# Patient Record
Sex: Female | Born: 1970 | Race: White | Hispanic: No | Marital: Married | Smoking: Never smoker
Health system: Southern US, Community
[De-identification: ages and names within clinical notes are randomized; demographics above are authoritative.]

## PROBLEM LIST (undated history)

## (undated) DIAGNOSIS — K219 Gastro-esophageal reflux disease without esophagitis: Secondary | ICD-10-CM

## (undated) DIAGNOSIS — R112 Nausea with vomiting, unspecified: Secondary | ICD-10-CM

## (undated) DIAGNOSIS — I10 Essential (primary) hypertension: Secondary | ICD-10-CM

## (undated) DIAGNOSIS — T7840XA Allergy, unspecified, initial encounter: Secondary | ICD-10-CM

## (undated) DIAGNOSIS — Z9889 Other specified postprocedural states: Secondary | ICD-10-CM

## (undated) DIAGNOSIS — R51 Headache: Secondary | ICD-10-CM

## (undated) DIAGNOSIS — M1990 Unspecified osteoarthritis, unspecified site: Secondary | ICD-10-CM

## (undated) HISTORY — PX: REPLACEMENT TOTAL KNEE: SUR1224

## (undated) HISTORY — DX: Allergy, unspecified, initial encounter: T78.40XA

## (undated) HISTORY — PX: KNEE ARTHROSCOPY: SUR90

## (undated) HISTORY — DX: Essential (primary) hypertension: I10

## (undated) HISTORY — PX: TUBAL LIGATION: SHX77

## (undated) HISTORY — PX: KNEE ARTHROSCOPY W/ ACL RECONSTRUCTION: SHX1858

## (undated) HISTORY — PX: OTHER SURGICAL HISTORY: SHX169

## (undated) HISTORY — PX: ARTHROSCOPIC REPAIR ACL: SUR80

---

## 1990-03-27 HISTORY — PX: BREAST REDUCTION SURGERY: SHX8

## 1999-03-28 HISTORY — PX: CARPAL TUNNEL RELEASE: SHX101

## 2007-03-28 HISTORY — PX: PARTIAL HYSTERECTOMY: SHX80

## 2012-11-28 ENCOUNTER — Encounter (INDEPENDENT_AMBULATORY_CARE_PROVIDER_SITE_OTHER): Payer: Self-pay | Admitting: General Surgery

## 2012-11-28 ENCOUNTER — Ambulatory Visit (INDEPENDENT_AMBULATORY_CARE_PROVIDER_SITE_OTHER): Payer: Managed Care, Other (non HMO) | Admitting: General Surgery

## 2012-11-28 DIAGNOSIS — Z6841 Body Mass Index (BMI) 40.0 and over, adult: Secondary | ICD-10-CM

## 2012-11-28 DIAGNOSIS — M129 Arthropathy, unspecified: Secondary | ICD-10-CM

## 2012-11-28 DIAGNOSIS — E785 Hyperlipidemia, unspecified: Secondary | ICD-10-CM

## 2012-11-28 DIAGNOSIS — M1990 Unspecified osteoarthritis, unspecified site: Secondary | ICD-10-CM

## 2012-11-28 DIAGNOSIS — I10 Essential (primary) hypertension: Secondary | ICD-10-CM

## 2012-11-28 NOTE — Progress Notes (Signed)
Patient ID: Kayla Cooley, female   DOB: Oct 26, 1970, 42 y.o.   MRN: [id number]  Chief Complaint  Patient presents with  . Other    Eval gastric sleeve    HPI Kayla Cooley is a 42 y.o. female.  This patient presents for her initial weight loss surgery evaluation with our practice. She was currently undergoing workup by the weight loss surgery program in [facility] when that program dissolved. She has a tender information session of and is interested in sleeve gastrectomy. She has a BMI of 40 with obesity-related quantities of hypertension and hyperlipidemia and arthritis. She says that she has always troubled with her weight and started dieting at age 59. She has been on several diets and has been successful with Nutrisystem diet where she lost 114 pounds but she regained the weight. She says that she is interested in sleeve gastrectomy because she's not interested in having the device placed in her and she is not interested in regarding the intestines. She says that she is active and is walking about 25 miles 6 days per week although she does have a history of arthritis in her knees and has had several knee replacements and knee surgeries. She denies reflux. HPI  Past Medical History  Diagnosis Date  . Allergy   . Hypertension     Past Surgical History  Procedure Laterality Date  . Total knee arthroplasty  04/2009    left knee  . Partial hysterectomy  2009  . Carpal tunnel release  2001    right hand  . Tubal ligation    . Breast reduction surgery  1992    History reviewed. No pertinent family history.  Social History History  Substance Use Topics  . Smoking status: Never Smoker   . Smokeless tobacco: Not on file  . Alcohol Use: Yes    Allergies  Allergen Reactions  . Aspirin   . Cashew Nut Oil     Current Outpatient Prescriptions  Medication Sig Dispense Refill  . cetirizine (ZYRTEC) 10 MG tablet Take 10 mg by mouth daily.      . metoprolol succinate  (TOPROL-XL) 25 MG 24 hr tablet Take 25 mg by mouth daily.      . Prenatal Vit-Fe Fumarate-FA (PRENATAL MULTIVITAMIN) TABS tablet Take 1 tablet by mouth daily at 12 noon.       No current facility-administered medications for this visit.    Review of Systems Review of Systems All other review of systems negative or noncontributory except as stated in the HPI  Blood pressure 144/84, pulse 82, resp. rate 18, height 5\' 1"  (1.549 m), weight 216 lb (97.977 kg).  Physical Exam Physical Exam Physical Exam  Nursing note and vitals reviewed. Constitutional: She is oriented to person, place, and time. She appears well-developed and well-nourished. No distress.  HENT:  Head: Normocephalic and atraumatic.  Mouth/Throat: No oropharyngeal exudate.  Eyes: Conjunctivae and EOM are normal. Pupils are equal, round, and reactive to light. Right eye exhibits no discharge. Left eye exhibits no discharge. No scleral icterus.  Neck: Normal range of motion. Neck supple. No tracheal deviation present.  Cardiovascular: Normal rate, regular rhythm, normal heart sounds and intact distal pulses.   Pulmonary/Chest: Effort normal and breath sounds normal. No stridor. No respiratory distress. She has no wheezes.  Abdominal: Soft. Bowel sounds are normal. She exhibits no distension and no mass. There is no tenderness. There is no rebound and no guarding.  Musculoskeletal: Normal range of motion. She exhibits no edema  and no tenderness.  Neurological: She is alert and oriented to person, place, and time.  Skin: Skin is warm and dry. No rash noted. She is not diaphoretic. No erythema. No pallor.  Psychiatric: She has a normal mood and affect. Her behavior is normal. Judgment and thought content normal.    Data Reviewed   Assessment    Morbid obesity with a BMI of 40 and obesity related comorbidities of hypertension, hyperlipidemia, and arthritis. We had a long discussion regarding all of the medical and surgical  options for weight loss. We discussed the pros and cons of each option including the lap band, sleeve gastrectomy, and Roux-en-Y gastric bypass. I think that she would be a fine candidate for any of the options which she chooses and she remains interested in vertical sleeve gastrectomy. We did discuss the procedure and its risks. The risks of infection, bleeding, pain, scarring, weight regain, too little or too much weight loss, vitamin deficiencies and need for lifelong vitamin supplementation, hair loss, need for protein supplementation, leaks, stricture, reflux, food intolerance, need for reoperation and conversion to roux Y gastric bypass, need for open surgery, injury to spleen or surrounding structures, DVT's, PE, and death again discussed with the patient and the patient expressed understanding and desires to proceed with laparoscopic vertical sleeve gastrectomy, possible open, intraoperative endoscopy.    Plan    We will set her up for upper GI and nutrition labs as well as psychology and nutrition evaluations.  We will try to obtain her prior records from [place] and go from there.       Lodema Pilot DAVID 11/28/2012, 4:23 PM

## 2012-11-29 LAB — LIPID PANEL
Cholesterol: 181 mg/dL (ref 0–200)
HDL: 45 mg/dL (ref >39)
Total CHOL/HDL Ratio: 4 Ratio
VLDL: 39 mg/dL (ref 0–40)

## 2012-11-29 LAB — COMPREHENSIVE METABOLIC PANEL
AST: 18 U/L (ref 0–37)
Albumin: 4.1 g/dL (ref 3.5–5.2)
Alkaline Phosphatase: 52 U/L (ref 39–117)
BUN: 12 mg/dL (ref 6–23)
Glucose, Bld: 85 mg/dL (ref 70–99)
Potassium: 3.8 mEq/L (ref 3.5–5.3)
Sodium: 139 mEq/L (ref 135–145)
Total Bilirubin: 0.4 mg/dL (ref 0.3–1.2)
Total Protein: 7.1 g/dL (ref 6.0–8.3)

## 2012-11-29 LAB — CBC WITH DIFFERENTIAL/PLATELET
Basophils Absolute: 0 10*3/uL (ref 0.0–0.1)
Basophils Relative: 0 % (ref 0–1)
Eosinophils Absolute: 0.1 10*3/uL (ref 0.0–0.7)
MCH: 28.8 pg (ref 26.0–34.0)
MCHC: 33.6 g/dL (ref 30.0–36.0)
Neutro Abs: 5.4 10*3/uL (ref 1.7–7.7)
Neutrophils Relative %: 75 % (ref 43–77)
RDW: 13.7 % (ref 11.5–15.5)

## 2012-11-29 LAB — T4: T4, Total: 7.8 ug/dL (ref 5.0–12.5)

## 2012-12-10 ENCOUNTER — Other Ambulatory Visit: Payer: Self-pay

## 2012-12-10 ENCOUNTER — Ambulatory Visit (HOSPITAL_COMMUNITY)
Admission: RE | Admit: 2012-12-10 | Discharge: 2012-12-10 | Disposition: A | Discharge status: Home / Self Care | Payer: Managed Care, Other (non HMO) | Source: Ambulatory Visit | Attending: General Surgery | Admitting: General Surgery

## 2012-12-10 DIAGNOSIS — E785 Hyperlipidemia, unspecified: Secondary | ICD-10-CM | POA: Insufficient documentation

## 2012-12-10 DIAGNOSIS — M1990 Unspecified osteoarthritis, unspecified site: Secondary | ICD-10-CM

## 2012-12-10 DIAGNOSIS — I10 Essential (primary) hypertension: Secondary | ICD-10-CM | POA: Insufficient documentation

## 2012-12-10 DIAGNOSIS — M129 Arthropathy, unspecified: Secondary | ICD-10-CM | POA: Insufficient documentation

## 2012-12-10 DIAGNOSIS — Z6841 Body Mass Index (BMI) 40.0 and over, adult: Secondary | ICD-10-CM | POA: Insufficient documentation

## 2012-12-11 ENCOUNTER — Ambulatory Visit (HOSPITAL_COMMUNITY): Payer: Self-pay

## 2012-12-18 ENCOUNTER — Other Ambulatory Visit (HOSPITAL_COMMUNITY): Payer: Self-pay

## 2012-12-26 ENCOUNTER — Other Ambulatory Visit (INDEPENDENT_AMBULATORY_CARE_PROVIDER_SITE_OTHER): Payer: Self-pay | Admitting: General Surgery

## 2013-01-06 ENCOUNTER — Telehealth (INDEPENDENT_AMBULATORY_CARE_PROVIDER_SITE_OTHER): Payer: Self-pay

## 2013-01-06 NOTE — Telephone Encounter (Signed)
Pre-op called stating her pre op schedule showed patient surgery was cancelled for 01-13-13 so she cancelled her pre- op appointment , the patient  Saw pre op cancellation on my Chart then called pre op asking why pre op was cancelled . Please advise Pt # [phone]

## 2013-01-08 ENCOUNTER — Encounter (HOSPITAL_COMMUNITY): Payer: Self-pay | Admitting: Pharmacy Technician

## 2013-01-10 ENCOUNTER — Encounter (HOSPITAL_COMMUNITY)
Admission: RE | Admit: 2013-01-10 | Discharge: 2013-01-10 | Disposition: A | Discharge status: Home / Self Care | Payer: Managed Care, Other (non HMO) | Source: Ambulatory Visit | Attending: General Surgery | Admitting: General Surgery

## 2013-01-10 ENCOUNTER — Ambulatory Visit (INDEPENDENT_AMBULATORY_CARE_PROVIDER_SITE_OTHER): Payer: Managed Care, Other (non HMO) | Admitting: General Surgery

## 2013-01-10 ENCOUNTER — Encounter (INDEPENDENT_AMBULATORY_CARE_PROVIDER_SITE_OTHER): Payer: Self-pay | Admitting: General Surgery

## 2013-01-10 ENCOUNTER — Encounter (HOSPITAL_COMMUNITY): Payer: Self-pay

## 2013-01-10 ENCOUNTER — Ambulatory Visit (HOSPITAL_COMMUNITY)
Admission: RE | Admit: 2013-01-10 | Discharge: 2013-01-10 | Disposition: A | Discharge status: Home / Self Care | Payer: Managed Care, Other (non HMO) | Source: Ambulatory Visit | Attending: General Surgery | Admitting: General Surgery

## 2013-01-10 ENCOUNTER — Other Ambulatory Visit (HOSPITAL_COMMUNITY): Payer: Managed Care, Other (non HMO)

## 2013-01-10 DIAGNOSIS — I10 Essential (primary) hypertension: Secondary | ICD-10-CM

## 2013-01-10 DIAGNOSIS — Z6841 Body Mass Index (BMI) 40.0 and over, adult: Secondary | ICD-10-CM

## 2013-01-10 DIAGNOSIS — Z01812 Encounter for preprocedural laboratory examination: Secondary | ICD-10-CM | POA: Insufficient documentation

## 2013-01-10 DIAGNOSIS — Z01818 Encounter for other preprocedural examination: Secondary | ICD-10-CM | POA: Insufficient documentation

## 2013-01-10 HISTORY — DX: Unspecified osteoarthritis, unspecified site: M19.90

## 2013-01-10 HISTORY — DX: Headache: R51

## 2013-01-10 HISTORY — DX: Nausea with vomiting, unspecified: R11.2

## 2013-01-10 HISTORY — DX: Other specified postprocedural states: Z98.890

## 2013-01-10 LAB — COMPREHENSIVE METABOLIC PANEL
ALT: 28 U/L (ref 0–35)
Alkaline Phosphatase: 59 U/L (ref 39–117)
CO2: 26 mEq/L (ref 19–32)
Chloride: 99 mEq/L (ref 96–112)
GFR calc Af Amer: >90 mL/min (ref >90)
GFR calc non Af Amer: >90 mL/min (ref >90)
Glucose, Bld: 87 mg/dL (ref 70–99)
Potassium: 3.8 mEq/L (ref 3.5–5.1)
Sodium: 136 mEq/L (ref 135–145)
Total Bilirubin: 0.4 mg/dL (ref 0.3–1.2)

## 2013-01-10 LAB — CBC WITH DIFFERENTIAL/PLATELET
Basophils Absolute: 0 10*3/uL (ref 0.0–0.1)
Eosinophils Relative: 1 % (ref 0–5)
Hemoglobin: 13.5 g/dL (ref 12.0–15.0)
Lymphocytes Relative: 23 % (ref 12–46)
Lymphs Abs: 1.6 10*3/uL (ref 0.7–4.0)
MCV: 86 fL (ref 78.0–100.0)
Neutro Abs: 5.1 10*3/uL (ref 1.7–7.7)
Neutrophils Relative %: 71 % (ref 43–77)
Platelets: 236 10*3/uL (ref 150–400)
RBC: 4.58 MIL/uL (ref 3.87–5.11)
WBC: 7.2 10*3/uL (ref 4.0–10.5)

## 2013-01-10 NOTE — Progress Notes (Signed)
Anesthesia will see patient am of surgery 

## 2013-01-10 NOTE — Progress Notes (Signed)
Patient ID: Kayla Cooley, female   DOB: 07-Jan-1971, 42 y.o.   MRN: [id number]  Chief Complaint  Patient presents with  . Bariatric Pre-op    HPI Kayla Cooley is a 42 y.o. female.  This patient presents today for her preoperative surgical evaluation for vertical sleeve gastrectomy. She has a BMI of 40 with obesity related comorbidities of hypertension hyperlipidemia, and arthritis. She has completed her preoperative workup and is ready for her procedure. Her upper GI was negative and her labs within normal. She is scheduled for vertical sleeve gastrectomy next week. She has lost about 8-10 pounds on her preoperative diet and HPI  Past Medical History  Diagnosis Date  . Allergy   . Hypertension     Past Surgical History  Procedure Laterality Date  . Total knee arthroplasty  04/2009    left knee  . Partial hysterectomy  2009  . Carpal tunnel release  2001    right hand  . Tubal ligation    . Breast reduction surgery  1992    History reviewed. No pertinent family history.  Social History History  Substance Use Topics  . Smoking status: Never Smoker   . Smokeless tobacco: Not on file  . Alcohol Use: Yes    Allergies  Allergen Reactions  . Aspirin Hives  . Cashew Nut Oil Hives    Current Outpatient Prescriptions  Medication Sig Dispense Refill  . cetirizine (ZYRTEC) 10 MG tablet Take 10 mg by mouth every morning.       . clobetasol cream (TEMOVATE) 0.05 % Apply 1 application topically daily as needed (For eczema.).      Marland Kitchen metoprolol succinate (TOPROL-XL) 25 MG 24 hr tablet Take 25 mg by mouth every morning.       . metoprolol tartrate (LOPRESSOR) 25 MG tablet       . Prenatal Vit-Fe Fumarate-FA (PRENATAL MULTIVITAMIN) TABS tablet Take 1 tablet by mouth every morning.        No current facility-administered medications for this visit.    Review of Systems Review of Systems All other review of systems negative or noncontributory except as stated in the  HPI  Blood pressure 132/80, pulse 74, temperature 98.1 F (36.7 C), temperature source Temporal, resp. rate 16, height 5\' 1"  (1.549 m), weight 204 lb 12.8 oz (92.897 kg).  Physical Exam Physical Exam Physical Exam  Nursing note and vitals reviewed. Constitutional: She is oriented to person, place, and time. She appears well-developed and well-nourished. No distress.  HENT:  Head: Normocephalic and atraumatic.  Mouth/Throat: No oropharyngeal exudate.  Eyes: Conjunctivae and EOM are normal. Pupils are equal, round, and reactive to light. Right eye exhibits no discharge. Left eye exhibits no discharge. No scleral icterus.  Neck: Normal range of motion. Neck supple. No tracheal deviation present.  Cardiovascular: Normal rate, regular rhythm, normal heart sounds and intact distal pulses.   Pulmonary/Chest: Effort normal and breath sounds normal. No stridor. No respiratory distress. She has no wheezes.  Abdominal: Soft. Bowel sounds are normal. She exhibits no distension and no mass. There is no tenderness. There is no rebound and no guarding.  Musculoskeletal: Normal range of motion. She exhibits no edema and no tenderness.  Neurological: She is alert and oriented to person, place, and time.  Skin: Skin is warm and dry. No rash noted. She is not diaphoretic. No erythema. No pallor.  Psychiatric: She has a normal mood and affect. Her behavior is normal. Judgment and thought content normal.    Data  Reviewed   Assessment    Morbid obesity with a BMI of 40 with obesity related comorbidities of hypertension, hyperlipidemia, and arthritis  She remains motivated and interested in vertical sleeve gastrectomy. We again discussed the options for weight loss and she remains interested in vertical sleeve gastrectomy. We discussed the procedure and its risks. The risks of infection, bleeding, pain, scarring, weight regain, too little or too much weight loss, vitamin deficiencies and need for lifelong  vitamin supplementation, hair loss, need for protein supplementation, leaks, stricture, reflux, food intolerance, need for reoperation and conversion to roux Y gastric bypass, need for open surgery, injury to spleen or surrounding structures, DVT's, PE, and death again discussed with the patient and the patient expressed understanding and desires to proceed with laparoscopic vertical sleeve gastrectomy, possible open, intraoperative endoscopy.     Plan    Will plan for vertical sleeve gastrectomy next week.        Lodema Pilot DAVID 01/10/2013, 9:49 AM

## 2013-01-10 NOTE — Patient Instructions (Signed)
20      Your procedure is scheduled on:  Monday 01/13/2013  Report to [facility] at  1200 pm  Call this number if you have problems the night before or morning of surgery:  [phone]   Remember:             IF YOU USE CPAP,BRING MASK AND TUBING AM OF SURGERY!   Do not eat food or drink liquids AFTER MIDNIGHT!  Take these medicines the morning of surgery with A SIP OF WATER: Metoprolol   Do not bring valuables to the hospital. [facility] IS NOT RESPONSIBLE FOR ANY BELONGINGS OR VALUABLES BROUGHT TO HOSPITAL.  Marland Kitchen  Leave suitcase in the car. After surgery it may be brought to your room.  For patients admitted to the hospital, checkout time is 11:00 AM the day of              Discharge.    DO NOT WEAR JEWELRY , MAKE-UP, LOTIONS,POWDERS,PERFUMES!             WOMEN -DO NOT SHAVE LEGS OR UNDERARMS 12 HRS. BEFORE  SURGERY!               MEN MAY SHAVE AS USUAL!             CONTACTS,DENTURES OR BRIDGEWORK, FALSE EYELASHES MAY NOT BE WORN INTO SURGERY!                                           Patients discharged the day of surgery will not be allowed to drive home. If going home the same day of surgery, must have someone stay with you first 24 hrs.at home and arrange for someone to drive you home from the Hospital.                         YOUR DRIVER IS: Spouse-Christopher   Special Instructions:             Please read over the following fact sheets that you were given:             1. [facility] PREPARING FOR SURGERY SHEET              2.[facility]OMET[facility]                                        [place].Raelin Pixler,RN,BSN     [phone]                FAILURE TO FOLLOW THESE INSTRUCTIONS MAY RESULT IN  CANCELLATION OF YOUR SURGERY!               Patient Signature:___________________________

## 2013-01-13 ENCOUNTER — Inpatient Hospital Stay (HOSPITAL_COMMUNITY)
Admission: RE | Admit: 2013-01-13 | Discharge: 2013-01-15 | DRG: 621 | Disposition: A | Discharge status: Home / Self Care | Payer: Managed Care, Other (non HMO) | Source: Ambulatory Visit | Attending: General Surgery | Admitting: General Surgery

## 2013-01-13 ENCOUNTER — Encounter (HOSPITAL_COMMUNITY)
Admission: RE | Disposition: A | Discharge status: Home / Self Care | Payer: Self-pay | Source: Ambulatory Visit | Attending: General Surgery

## 2013-01-13 ENCOUNTER — Inpatient Hospital Stay (HOSPITAL_COMMUNITY): Payer: Managed Care, Other (non HMO) | Admitting: Anesthesiology

## 2013-01-13 ENCOUNTER — Inpatient Hospital Stay: Admit: 2013-01-13 | Payer: Managed Care, Other (non HMO) | Admitting: General Surgery

## 2013-01-13 ENCOUNTER — Encounter (HOSPITAL_COMMUNITY): Payer: Self-pay | Admitting: *Deleted

## 2013-01-13 ENCOUNTER — Encounter (HOSPITAL_COMMUNITY): Payer: Managed Care, Other (non HMO) | Admitting: Anesthesiology

## 2013-01-13 DIAGNOSIS — I10 Essential (primary) hypertension: Secondary | ICD-10-CM

## 2013-01-13 DIAGNOSIS — E785 Hyperlipidemia, unspecified: Secondary | ICD-10-CM

## 2013-01-13 DIAGNOSIS — Z6841 Body Mass Index (BMI) 40.0 and over, adult: Secondary | ICD-10-CM

## 2013-01-13 DIAGNOSIS — Z96659 Presence of unspecified artificial knee joint: Secondary | ICD-10-CM

## 2013-01-13 DIAGNOSIS — Z79899 Other long term (current) drug therapy: Secondary | ICD-10-CM

## 2013-01-13 DIAGNOSIS — M129 Arthropathy, unspecified: Secondary | ICD-10-CM | POA: Diagnosis present

## 2013-01-13 HISTORY — PX: LAPAROSCOPIC GASTRIC SLEEVE RESECTION: SHX5895

## 2013-01-13 HISTORY — PX: UPPER GI ENDOSCOPY: SHX6162

## 2013-01-13 SURGERY — GASTRECTOMY, SLEEVE, LAPAROSCOPIC
Anesthesia: General

## 2013-01-13 SURGERY — GASTRECTOMY, SLEEVE, LAPAROSCOPIC
Anesthesia: General | Site: Esophagus | Wound class: Clean Contaminated

## 2013-01-13 MED ORDER — DEXAMETHASONE SODIUM PHOSPHATE 10 MG/ML IJ SOLN
INTRAMUSCULAR | Status: DC | PRN
  Administered 2013-01-13: 10 mg via INTRAVENOUS

## 2013-01-13 MED ORDER — METOPROLOL TARTRATE 25 MG PO TABS
25.0000 mg | ORAL_TABLET | Freq: Every day | ORAL | Status: DC
  Administered 2013-01-14 – 2013-01-15 (×2): 25 mg via ORAL
  Filled 2013-01-13 (×2): qty 1

## 2013-01-13 MED ORDER — TISSEEL VH 10 ML EX KIT
PACK | CUTANEOUS | Status: DC | PRN
  Administered 2013-01-13: 1

## 2013-01-13 MED ORDER — PROMETHAZINE HCL 25 MG/ML IJ SOLN
INTRAMUSCULAR | Status: AC
  Filled 2013-01-13: qty 1

## 2013-01-13 MED ORDER — ACETAMINOPHEN 10 MG/ML IV SOLN
1000.0000 mg | Freq: Four times a day (QID) | INTRAVENOUS | Status: DC
  Administered 2013-01-13: 1000 mg via INTRAVENOUS
  Filled 2013-01-13: qty 100

## 2013-01-13 MED ORDER — OXYCODONE HCL 5 MG PO TABS: 5.0000 mg | ORAL_TABLET | Freq: Once | ORAL | Status: DC | PRN

## 2013-01-13 MED ORDER — ACETAMINOPHEN 160 MG/5ML PO SOLN: 650.0000 mg | ORAL | Status: DC | PRN

## 2013-01-13 MED ORDER — GLYCOPYRROLATE 0.2 MG/ML IJ SOLN
INTRAMUSCULAR | Status: DC | PRN
  Administered 2013-01-13: 0.6 mg via INTRAVENOUS

## 2013-01-13 MED ORDER — UNJURY VANILLA POWDER: 2.0000 [oz_av] | Freq: Four times a day (QID) | ORAL | Status: DC

## 2013-01-13 MED ORDER — SCOPOLAMINE 1 MG/3DAYS TD PT72
MEDICATED_PATCH | TRANSDERMAL | Status: DC | PRN
  Administered 2013-01-13: 1 via TRANSDERMAL

## 2013-01-13 MED ORDER — MIDAZOLAM HCL 5 MG/5ML IJ SOLN
INTRAMUSCULAR | Status: DC | PRN
  Administered 2013-01-13: 2 mg via INTRAVENOUS

## 2013-01-13 MED ORDER — TISSEEL VH 10 ML EX KIT
PACK | CUTANEOUS | Status: AC
  Filled 2013-01-13: qty 1

## 2013-01-13 MED ORDER — PROPOFOL 10 MG/ML IV BOLUS
INTRAVENOUS | Status: DC | PRN
  Administered 2013-01-13: 150 mg via INTRAVENOUS

## 2013-01-13 MED ORDER — LIDOCAINE HCL (PF) 2 % IJ SOLN
INTRAMUSCULAR | Status: DC | PRN
  Administered 2013-01-13: 40 mg

## 2013-01-13 MED ORDER — NEOSTIGMINE METHYLSULFATE 1 MG/ML IJ SOLN
INTRAMUSCULAR | Status: DC | PRN
  Administered 2013-01-13: 4 mg via INTRAVENOUS

## 2013-01-13 MED ORDER — LIDOCAINE-EPINEPHRINE (PF) 1 %-1:200000 IJ SOLN
INTRAMUSCULAR | Status: AC
  Filled 2013-01-13: qty 10

## 2013-01-13 MED ORDER — ENOXAPARIN SODIUM 40 MG/0.4ML ~~LOC~~ SOLN
40.0000 mg | Freq: Two times a day (BID) | SUBCUTANEOUS | Status: DC
  Administered 2013-01-14 – 2013-01-15 (×3): 40 mg via SUBCUTANEOUS
  Filled 2013-01-13 (×5): qty 0.4

## 2013-01-13 MED ORDER — KCL IN DEXTROSE-NACL 20-5-0.45 MEQ/L-%-% IV SOLN
INTRAVENOUS | Status: DC
  Administered 2013-01-13 – 2013-01-15 (×5): via INTRAVENOUS
  Filled 2013-01-13 (×7): qty 1000

## 2013-01-13 MED ORDER — SODIUM CHLORIDE 0.9 % IV SOLN
1.0000 g | Freq: Once | INTRAVENOUS | Status: AC
  Administered 2013-01-13: 1 g via INTRAVENOUS

## 2013-01-13 MED ORDER — FENTANYL CITRATE 0.05 MG/ML IJ SOLN
INTRAMUSCULAR | Status: DC | PRN
  Administered 2013-01-13: 100 ug via INTRAVENOUS
  Administered 2013-01-13: 50 ug via INTRAVENOUS

## 2013-01-13 MED ORDER — ACETAMINOPHEN 10 MG/ML IV SOLN
INTRAVENOUS | Status: DC | PRN
  Administered 2013-01-13: 1000 mg via INTRAVENOUS

## 2013-01-13 MED ORDER — SCOPOLAMINE 1 MG/3DAYS TD PT72
MEDICATED_PATCH | TRANSDERMAL | Status: AC
  Filled 2013-01-13: qty 1

## 2013-01-13 MED ORDER — SODIUM CHLORIDE 0.9 % IV SOLN
INTRAVENOUS | Status: AC
  Filled 2013-01-13: qty 1

## 2013-01-13 MED ORDER — ONDANSETRON HCL 4 MG/2ML IJ SOLN
INTRAMUSCULAR | Status: DC | PRN
  Administered 2013-01-13: 4 mg via INTRAVENOUS

## 2013-01-13 MED ORDER — MORPHINE SULFATE 2 MG/ML IJ SOLN: 2.0000 mg | INTRAMUSCULAR | Status: DC | PRN

## 2013-01-13 MED ORDER — ONDANSETRON HCL 4 MG/2ML IJ SOLN: 4.0000 mg | INTRAMUSCULAR | Status: DC | PRN

## 2013-01-13 MED ORDER — ROCURONIUM BROMIDE 100 MG/10ML IV SOLN
INTRAVENOUS | Status: DC | PRN
  Administered 2013-01-13: 10 mg via INTRAVENOUS
  Administered 2013-01-13: 50 mg via INTRAVENOUS

## 2013-01-13 MED ORDER — BUPIVACAINE HCL (PF) 0.25 % IJ SOLN
INTRAMUSCULAR | Status: AC
  Filled 2013-01-13: qty 30

## 2013-01-13 MED ORDER — LIDOCAINE-EPINEPHRINE 1 %-1:100000 IJ SOLN
INTRAMUSCULAR | Status: DC | PRN
  Administered 2013-01-13: 27.5 mL

## 2013-01-13 MED ORDER — UNJURY CHICKEN SOUP POWDER: 2.0000 [oz_av] | Freq: Four times a day (QID) | ORAL | Status: DC

## 2013-01-13 MED ORDER — HEPARIN SODIUM (PORCINE) 5000 UNIT/ML IJ SOLN
5000.0000 [IU] | Freq: Once | INTRAMUSCULAR | Status: AC
  Administered 2013-01-13: 5000 [IU] via SUBCUTANEOUS
  Filled 2013-01-13: qty 1

## 2013-01-13 MED ORDER — HYDROMORPHONE HCL PF 1 MG/ML IJ SOLN: 0.2500 mg | INTRAMUSCULAR | Status: DC | PRN

## 2013-01-13 MED ORDER — PROPOFOL INFUSION 10 MG/ML OPTIME
INTRAVENOUS | Status: DC | PRN
  Administered 2013-01-13: 20 ug/kg/min via INTRAVENOUS

## 2013-01-13 MED ORDER — BUPIVACAINE HCL 0.25 % IJ SOLN
INTRAMUSCULAR | Status: DC | PRN
  Administered 2013-01-13: 27.5 mL

## 2013-01-13 MED ORDER — PROMETHAZINE HCL 25 MG/ML IJ SOLN
6.2500 mg | INTRAMUSCULAR | Status: AC | PRN
  Administered 2013-01-13 (×2): 12.5 mg via INTRAVENOUS

## 2013-01-13 MED ORDER — LACTATED RINGERS IV SOLN
INTRAVENOUS | Status: AC
  Administered 2013-01-13: 18:00:00 via INTRAVENOUS
  Administered 2013-01-13: 1000 mL via INTRAVENOUS

## 2013-01-13 MED ORDER — ONDANSETRON HCL 4 MG/2ML IJ SOLN
INTRAMUSCULAR | Status: AC
  Filled 2013-01-13: qty 2

## 2013-01-13 MED ORDER — UNJURY CHOCOLATE CLASSIC POWDER
2.0000 [oz_av] | Freq: Four times a day (QID) | ORAL | Status: DC
  Administered 2013-01-15: 2 [oz_av] via ORAL

## 2013-01-13 MED ORDER — OXYCODONE HCL 5 MG/5ML PO SOLN
5.0000 mg | Freq: Once | ORAL | Status: DC | PRN
  Filled 2013-01-13: qty 5

## 2013-01-13 MED ORDER — KCL IN DEXTROSE-NACL 20-5-0.45 MEQ/L-%-% IV SOLN
INTRAVENOUS | Status: AC
  Filled 2013-01-13: qty 1000

## 2013-01-13 MED ORDER — MEPERIDINE HCL 50 MG/ML IJ SOLN: 6.2500 mg | INTRAMUSCULAR | Status: DC | PRN

## 2013-01-13 SURGICAL SUPPLY — 50 items
APPLICATOR COTTON TIP 6IN STRL (MISCELLANEOUS) IMPLANT
APPLIER CLIP ROT 10 11.4 M/L (STAPLE)
BIOPATCH RED 1 DISK 7.0 (GAUZE/BANDAGES/DRESSINGS) ×3 IMPLANT
CABLE HIGH FREQUENCY MONO STRZ (ELECTRODE) ×3 IMPLANT
CANISTER SUCTION 2500CC (MISCELLANEOUS) ×6 IMPLANT
CHLORAPREP W/TINT 26ML (MISCELLANEOUS) ×6 IMPLANT
CLIP APPLIE ROT 10 11.4 M/L (STAPLE) IMPLANT
CLOTH BEACON ORANGE TIMEOUT ST (SAFETY) IMPLANT
DERMABOND ADVANCED (GAUZE/BANDAGES/DRESSINGS) ×1
DERMABOND ADVANCED .7 DNX12 (GAUZE/BANDAGES/DRESSINGS) ×2 IMPLANT
DEVICE SUTURE ENDOST 10MM (ENDOMECHANICALS) IMPLANT
DRAIN CHANNEL 19F RND (DRAIN) ×3 IMPLANT
DRAPE LAPAROSCOPIC ABDOMINAL (DRAPES) ×3 IMPLANT
DRAPE UTILITY XL STRL (DRAPES) ×6 IMPLANT
ELECT REM PT RETURN 9FT ADLT (ELECTROSURGICAL) ×3
ELECTRODE REM PT RTRN 9FT ADLT (ELECTROSURGICAL) ×2 IMPLANT
EVACUATOR SILICONE 100CC (DRAIN) ×3 IMPLANT
GLOVE SURG SS PI 7.5 STRL IVOR (GLOVE) ×6 IMPLANT
GOWN PREVENTION PLUS LG XLONG (DISPOSABLE) IMPLANT
GOWN STRL REIN XL XLG (GOWN DISPOSABLE) ×12 IMPLANT
HANDLE STAPLE EGIA 4 XL (STAPLE) ×3 IMPLANT
HOVERMATT SINGLE USE (MISCELLANEOUS) ×3 IMPLANT
KIT BASIN OR (CUSTOM PROCEDURE TRAY) ×3 IMPLANT
MARKER SKIN DUAL TIP RULER LAB (MISCELLANEOUS) ×3 IMPLANT
NEEDLE SPNL 22GX3.5 QUINCKE BK (NEEDLE) ×3 IMPLANT
NS IRRIG 1000ML POUR BTL (IV SOLUTION) ×3 IMPLANT
PENCIL BUTTON HOLSTER BLD 10FT (ELECTRODE) ×3 IMPLANT
POUCH SPECIMEN RETRIEVAL 10MM (ENDOMECHANICALS) ×3 IMPLANT
RELOAD EGIA 60 MED/THCK PURPLE (STAPLE) ×3 IMPLANT
RELOAD TRI 2.0 60 XTHK VAS SUL (STAPLE) ×6 IMPLANT
SCISSORS LAP 5X35 DISP (ENDOMECHANICALS) ×3 IMPLANT
SEALANT SURGICAL APPL DUAL CAN (MISCELLANEOUS) ×3 IMPLANT
SET IRRIG TUBING LAPAROSCOPIC (IRRIGATION / IRRIGATOR) ×3 IMPLANT
SHEARS CURVED HARMONIC AC 45CM (MISCELLANEOUS) ×3 IMPLANT
SLEEVE XCEL OPT CAN 5 100 (ENDOMECHANICALS) ×6 IMPLANT
SOLUTION ANTI FOG 6CC (MISCELLANEOUS) ×3 IMPLANT
SPONGE DRAIN TRACH 4X4 STRL 2S (GAUZE/BANDAGES/DRESSINGS) ×3 IMPLANT
SPONGE GAUZE 4X4 12PLY (GAUZE/BANDAGES/DRESSINGS) IMPLANT
SPONGE LAP 18X18 X RAY DECT (DISPOSABLE) ×3 IMPLANT
SUT ETHILON 2 0 PS N (SUTURE) ×3 IMPLANT
SUT MNCRL AB 4-0 PS2 18 (SUTURE) ×6 IMPLANT
SUT VICRYL 0 UR6 27IN ABS (SUTURE) ×3 IMPLANT
SYR 50ML LL SCALE MARK (SYRINGE) ×3 IMPLANT
TRAY FOLEY CATH 14FRSI W/METER (CATHETERS) ×3 IMPLANT
TRAY LAP CHOLE (CUSTOM PROCEDURE TRAY) ×3 IMPLANT
TROCAR BLADELESS 15MM (ENDOMECHANICALS) ×3 IMPLANT
TROCAR BLADELESS OPT 5 100 (ENDOMECHANICALS) ×3 IMPLANT
TUBING CONNECTING 10 (TUBING) ×6 IMPLANT
TUBING ENDO SMARTCAP (MISCELLANEOUS) ×3 IMPLANT
TUBING FILTER THERMOFLATOR (ELECTROSURGICAL) ×3 IMPLANT

## 2013-01-13 NOTE — Op Note (Signed)
Kayla Cooley [id number] [birth year] 01/13/2013  Preoperative diagnosis: morbid obesity  Postoperative diagnosis: Same   Procedure: Upper endoscopy   Surgeon: Mary Sella. Nayellie Sanseverino M.D., FACS   Anesthesia: Gen.   Indications for procedure: 42year old WF undergoing Laparoscopic Gastric Sleeve Gastrectomy and an EGD was requested to evaluate the new gastric sleeve.   Description of procedure: After we have completed the sleeve resection, I scrubbed out and obtained the Olympus endoscope. I gently placed endoscope in the patient's oropharynx and gently glided it down the esophagus without any difficulty under direct visualization. Once I was in the gastric sleeve (GE jxn 39cm from incisors), I insufflated the stomach with air. I was able to cannulate and advanced the scope through the gastric sleeve. I was able to cannulate the duodenum with ease. Dr. Biagio Quint had placed saline in the upper abdomen. Upon further insufflation of the gastric sleeve there was no evidence of bubbles. Upon further inspection of the gastric sleeve, the mucosa appeared normal. There is no evidence of any mucosal abnormality. The sleeve did not twist or corkscrew. There was no evidence of bleeding. The gastric sleeve was decompressed. The scope was withdrawn. The patient tolerated this portion of the procedure well. Please see Dr Delice Lesch operative note for details regarding the laparoscopic gastric sleeve resection.   Mary Sella. [name], MD, FACS  General, Bariatric, & Minimally Invasive Surgery  [facility], [place]

## 2013-01-13 NOTE — Interval H&P Note (Signed)
History and Physical Interval Note:  01/13/2013 2:35 PM  Kayla Cooley  has presented today for surgery, with the diagnosis of morbid obesity  The various methods of treatment have been discussed with the patient and family. After consideration of risks, benefits and other options for treatment, the patient has consented to  Procedure(s): LAPAROSCOPIC GASTRIC SLEEVE RESECTION (N/A) as a surgical intervention .  The patient's history has been reviewed, patient examined, no change in status, stable for surgery.  I have reviewed the patient's chart and labs.  Questions were answered to the patient's satisfaction.  I have seen and evaluated this patient in the preop area.  The procedure and the risks were again discussed with the patient.  The risks of infection, bleeding, pain, scarring, weight regain, too little or too much weight loss, vitamin deficiencies and need for lifelong vitamin supplementation, hair loss, need for protein supplementation, leaks, stricture, reflux, food intolerance, need for reoperation and conversion to roux Y gastric bypass, need for open surgery, injury to spleen or surrounding structures, DVT's, PE, and death again discussed with the patient and the patient expressed understanding and desires to proceed with laparoscopic vertical sleeve gastrectomy, possible open, intraoperative endoscopy.     Lodema Pilot DAVID

## 2013-01-13 NOTE — Anesthesia Preprocedure Evaluation (Addendum)
Anesthesia Evaluation  Patient identified by MRN, date of birth, ID band Patient awake    Reviewed: Allergy & Precautions, H&P , NPO status , Patient's Chart, lab work & pertinent test results  History of Anesthesia Complications (+) PONV and history of anesthetic complications  Airway Mallampati: II TM Distance: >3 FB Neck ROM: Full    Dental  (+) Dental Advisory Given   Pulmonary neg pulmonary ROS,  breath sounds clear to auscultation        Cardiovascular hypertension, Pt. on medications Rhythm:Regular Rate:Normal     Neuro/Psych  Headaches, negative psych ROS   GI/Hepatic negative GI ROS, Neg liver ROS,   Endo/Other  Morbid obesity  Renal/GU negative Renal ROS     Musculoskeletal negative musculoskeletal ROS (+)   Abdominal   Peds  Hematology negative hematology ROS (+)   Anesthesia Other Findings   Reproductive/Obstetrics negative OB ROS                           Anesthesia Physical Anesthesia Plan  ASA: III  Anesthesia Plan: General   Post-op Pain Management:    Induction: Intravenous  Airway Management Planned: Oral ETT  Additional Equipment:   Intra-op Plan:   Post-operative Plan: Extubation in OR  Informed Consent: I have reviewed the patients History and Physical, chart, labs and discussed the procedure including the risks, benefits and alternatives for the proposed anesthesia with the patient or authorized representative who has indicated his/her understanding and acceptance.   Dental advisory given  Plan Discussed with: CRNA  Anesthesia Plan Comments:         Anesthesia Quick Evaluation

## 2013-01-13 NOTE — Brief Op Note (Signed)
01/13/2013  5:14 PM  PATIENT:  Kayla Cooley  42 y.o. female  PRE-OPERATIVE DIAGNOSIS:  morbid obesity  POST-OPERATIVE DIAGNOSIS:  morbid obesity  PROCEDURE:  Procedure(s): LAPAROSCOPIC GASTRIC SLEEVE RESECTION (N/A) UPPER GI ENDOSCOPY  SURGEON:  Surgeon(s) and Role:    * Lodema Pilot, DO - Primary    * Atilano Ina, MD - Assisting  PHYSICIAN ASSISTANT:   ASSISTANTS: wilson   ANESTHESIA:   general  EBL:  Total I/O In: -  Out: 100 [Urine:100]  BLOOD ADMINISTERED:none  DRAINS: (63F ) Jackson-Pratt drain(s) with closed bulb suction in the sleeve staple line   LOCAL MEDICATIONS USED:  MARCAINE    and LIDOCAINE   SPECIMEN:  Source of Specimen:  sleeve gastrectomy  DISPOSITION OF SPECIMEN:  PATHOLOGY  COUNTS:  YES  TOURNIQUET:  * No tourniquets in log *  DICTATION: .Other Dictation: Dictation Number dictated  PLAN OF CARE: Admit to inpatient   PATIENT DISPOSITION:  PACU - hemodynamically stable.   Delay start of Pharmacological VTE agent (>24hrs) due to surgical blood loss or risk of bleeding: no

## 2013-01-13 NOTE — Transfer of Care (Signed)
Immediate Anesthesia Transfer of Care Note  Patient: Kayla Cooley  Procedure(s) Performed: Procedure(s) (LRB): LAPAROSCOPIC GASTRIC SLEEVE RESECTION (N/A) UPPER GI ENDOSCOPY  Patient Location: PACU  Anesthesia Type: General  Level of Consciousness: sedated, patient cooperative and responds to stimulation  Airway & Oxygen Therapy: Patient Spontanous Breathing and Patient connected to face mask oxgen  Post-op Assessment: Report given to PACU RN and Post -op Vital signs reviewed and stable  Post vital signs: Reviewed and stable  Complications: No apparent anesthesia complications

## 2013-01-13 NOTE — Progress Notes (Signed)
Patient able to ambulate to restroom to urinate 500 cc, became extremely nauseated with movement and unable to ambulate in the hall.  Patient placed back in bed with head and foot elevated.

## 2013-01-13 NOTE — Progress Notes (Signed)
RN attempted to ambulate Kayla Cooley X2,Kayla Cooley stated she was nauseous and did not want to get up at this time. Also explained to Kayla Cooley that she needed to void before 2200, she stated she might get up in a little while. Call light left within reach, and Kayla Cooley told to call if she needed to get up. Will continue to monitor Kayla Cooley. CLum Keas

## 2013-01-13 NOTE — Preoperative (Signed)
Beta Blockers   Reason not to administer Beta Blockers:Not Applicable 

## 2013-01-13 NOTE — H&P (View-Only) (Signed)
Patient ID: Kayla Cooley, female   DOB: 10/15/1970, 42 y.o.   MRN: [id number]  Chief Complaint  Patient presents with  . Bariatric Pre-op    HPI Kayla Cooley is a 42 y.o. female.  This patient presents today for her preoperative surgical evaluation for vertical sleeve gastrectomy. She has a BMI of 40 with obesity related comorbidities of hypertension hyperlipidemia, and arthritis. She has completed her preoperative workup and is ready for her procedure. Her upper GI was negative and her labs within normal. She is scheduled for vertical sleeve gastrectomy next week. She has lost about 8-10 pounds on her preoperative diet and HPI  Past Medical History  Diagnosis Date  . Allergy   . Hypertension     Past Surgical History  Procedure Laterality Date  . Total knee arthroplasty  04/2009    left knee  . Partial hysterectomy  2009  . Carpal tunnel release  2001    right hand  . Tubal ligation    . Breast reduction surgery  1992    History reviewed. No pertinent family history.  Social History History  Substance Use Topics  . Smoking status: Never Smoker   . Smokeless tobacco: Not on file  . Alcohol Use: Yes    Allergies  Allergen Reactions  . Aspirin Hives  . Cashew Nut Oil Hives    Current Outpatient Prescriptions  Medication Sig Dispense Refill  . cetirizine (ZYRTEC) 10 MG tablet Take 10 mg by mouth every morning.       . clobetasol cream (TEMOVATE) 0.05 % Apply 1 application topically daily as needed (For eczema.).      . metoprolol succinate (TOPROL-XL) 25 MG 24 hr tablet Take 25 mg by mouth every morning.       . metoprolol tartrate (LOPRESSOR) 25 MG tablet       . Prenatal Vit-Fe Fumarate-FA (PRENATAL MULTIVITAMIN) TABS tablet Take 1 tablet by mouth every morning.        No current facility-administered medications for this visit.    Review of Systems Review of Systems All other review of systems negative or noncontributory except as stated in the  HPI  Blood pressure 132/80, pulse 74, temperature 98.1 F (36.7 C), temperature source Temporal, resp. rate 16, height 5' 1" (1.549 m), weight 204 lb 12.8 oz (92.897 kg).  Physical Exam Physical Exam Physical Exam  Nursing note and vitals reviewed. Constitutional: She is oriented to person, place, and time. She appears well-developed and well-nourished. No distress.  HENT:  Head: Normocephalic and atraumatic.  Mouth/Throat: No oropharyngeal exudate.  Eyes: Conjunctivae and EOM are normal. Pupils are equal, round, and reactive to light. Right eye exhibits no discharge. Left eye exhibits no discharge. No scleral icterus.  Neck: Normal range of motion. Neck supple. No tracheal deviation present.  Cardiovascular: Normal rate, regular rhythm, normal heart sounds and intact distal pulses.   Pulmonary/Chest: Effort normal and breath sounds normal. No stridor. No respiratory distress. She has no wheezes.  Abdominal: Soft. Bowel sounds are normal. She exhibits no distension and no mass. There is no tenderness. There is no rebound and no guarding.  Musculoskeletal: Normal range of motion. She exhibits no edema and no tenderness.  Neurological: She is alert and oriented to person, place, and time.  Skin: Skin is warm and dry. No rash noted. She is not diaphoretic. No erythema. No pallor.  Psychiatric: She has a normal mood and affect. Her behavior is normal. Judgment and thought content normal.    Data   Reviewed   Assessment    Morbid obesity with a BMI of 40 with obesity related comorbidities of hypertension, hyperlipidemia, and arthritis  She remains motivated and interested in vertical sleeve gastrectomy. We again discussed the options for weight loss and she remains interested in vertical sleeve gastrectomy. We discussed the procedure and its risks. The risks of infection, bleeding, pain, scarring, weight regain, too little or too much weight loss, vitamin deficiencies and need for lifelong  vitamin supplementation, hair loss, need for protein supplementation, leaks, stricture, reflux, food intolerance, need for reoperation and conversion to roux Y gastric bypass, need for open surgery, injury to spleen or surrounding structures, DVT's, PE, and death again discussed with the patient and the patient expressed understanding and desires to proceed with laparoscopic vertical sleeve gastrectomy, possible open, intraoperative endoscopy.     Plan    Will plan for vertical sleeve gastrectomy next week.        Kayla Cooley DAVID 01/10/2013, 9:49 AM    

## 2013-01-13 NOTE — Anesthesia Postprocedure Evaluation (Signed)
Anesthesia Post Note  Patient: Kayla Cooley  Procedure(s) Performed: Procedure(s) (LRB): LAPAROSCOPIC GASTRIC SLEEVE RESECTION (N/A) UPPER GI ENDOSCOPY  Anesthesia type: General  Patient location: PACU  Post pain: Pain level controlled  Post assessment: Post-op Vital signs reviewed  Last Vitals: BP 151/82  Pulse 69  Temp(Src) 36.9 C (Oral)  Resp 16  Ht 5\' 1"  (1.549 m)  Wt 202 lb 9.6 oz (91.899 kg)  BMI 38.3 kg/m2  SpO2 100%  Post vital signs: Reviewed  Level of consciousness: sedated  Complications: No apparent anesthesia complications

## 2013-01-14 ENCOUNTER — Encounter (HOSPITAL_COMMUNITY): Payer: Self-pay | Admitting: General Surgery

## 2013-01-14 LAB — CBC WITH DIFFERENTIAL/PLATELET
Basophils Absolute: 0 10*3/uL (ref 0.0–0.1)
Basophils Relative: 0 % (ref 0–1)
Eosinophils Absolute: 0 10*3/uL (ref 0.0–0.7)
Hemoglobin: 13.9 g/dL (ref 12.0–15.0)
Lymphs Abs: 0.8 10*3/uL (ref 0.7–4.0)
MCH: 29.4 pg (ref 26.0–34.0)
MCHC: 34.3 g/dL (ref 30.0–36.0)
MCV: 85.8 fL (ref 78.0–100.0)
Monocytes Absolute: 0.6 10*3/uL (ref 0.1–1.0)
Neutrophils Relative %: 87 % — ABNORMAL HIGH (ref 43–77)
Platelets: 236 10*3/uL (ref 150–400)
RBC: 4.72 MIL/uL (ref 3.87–5.11)

## 2013-01-14 LAB — COMPREHENSIVE METABOLIC PANEL
ALT: 36 U/L — ABNORMAL HIGH (ref 0–35)
AST: 26 U/L (ref 0–37)
Albumin: 3.7 g/dL (ref 3.5–5.2)
Alkaline Phosphatase: 57 U/L (ref 39–117)
Potassium: 3.7 mEq/L (ref 3.5–5.1)
Sodium: 131 mEq/L — ABNORMAL LOW (ref 135–145)
Total Protein: 7.5 g/dL (ref 6.0–8.3)

## 2013-01-14 MED ORDER — PANTOPRAZOLE SODIUM 40 MG IV SOLR
40.0000 mg | INTRAVENOUS | Status: DC
  Administered 2013-01-14: 40 mg via INTRAVENOUS
  Filled 2013-01-14 (×2): qty 40

## 2013-01-14 MED ORDER — PROMETHAZINE HCL 25 MG/ML IJ SOLN: 25.0000 mg | Freq: Four times a day (QID) | INTRAMUSCULAR | Status: DC | PRN

## 2013-01-14 MED ORDER — ACETAMINOPHEN 10 MG/ML IV SOLN
1000.0000 mg | Freq: Once | INTRAVENOUS | Status: AC
  Administered 2013-01-14: 1000 mg via INTRAVENOUS
  Filled 2013-01-14: qty 100

## 2013-01-14 MED ORDER — KETOROLAC TROMETHAMINE 30 MG/ML IJ SOLN
30.0000 mg | Freq: Four times a day (QID) | INTRAMUSCULAR | Status: DC | PRN
  Administered 2013-01-14 – 2013-01-15 (×2): 30 mg via INTRAVENOUS
  Filled 2013-01-14 (×2): qty 1

## 2013-01-14 NOTE — Op Note (Signed)
NAMETARESA, MONTVILLE NO.:  [id number]  MEDICAL RECORD NO.:  [id number]  LOCATION:  1522                         FACILITY:  [facility]  PHYSICIAN:  Lodema Pilot, MD       DATE OF BIRTH:  [birth year]  DATE OF PROCEDURE:  01/13/2013 DATE OF DISCHARGE:                              OPERATIVE REPORT   PROCEDURE:  Laparoscopic vertical sleeve gastrectomy with intraoperative endoscopy.  PREOPERATIVE DIAGNOSIS:  Morbid obesity.  POSTOPERATIVE DIAGNOSIS:  Morbid obesity.  SURGEON:  Lodema Pilot, MD  ASSISTANT:  Dr. [name].  ANESTHESIA:  General endotracheal tube anesthesia with two 5 mL of 1% lidocaine with epinephrine and 0.25% Marcaine in a 50:50 mixture.  FLUIDS:  900 mL of crystalloid.  ESTIMATED BLOOD LOSS:  Minimal.  DRAINS:  A 19-French Blake drain placed along the staple lines.  SPECIMENS:  Three gastrectomy sent to Pathology for permanent sectioning.  COMPLICATIONS:  None apparent.  FINDINGS:  Normal-appearing anatomy and free gastrectomy performed 5 cm from the pylorus over 36-French bougie.  Intraoperative leak test was negative and a 19-French Blake drain placed along the staple line.  COMPLICATIONS:  None apparent.  INDICATION FOR PROCEDURE:  Mr. Courtright is a 42 year old female with a BMI of 18 who has failed medical weight loss attempts and desired surgical weight loss solution.  OPERATIVE DETAILS:  Mr. Stlaurent was seen and evaluated in the preoperative area.  Risks and benefits of the procedure were again discussed in lay terms.  Informed consent was obtained.  She was given prophylactic antibiotics and subcu heparin, taken to the operating room, placed on the table in a supine position.  General endotracheal tube anesthesia obtained and Foley catheter was placed.  Her abdomen was prepped and draped in a standard surgical fashion and procedure time-out was performed with all operative team members to confirm proper patient and procedure.   A 5-mm Optiview trocar was used to access the peritoneum and the left upper quadrant and pneumoperitoneum was obtained. Laparoscope was introduced and there was no evidence of bleeding or bowel injury upon entry.  A 5-mm left rectus port was placed under direct visualization.  A 15 mm right rectus port and a 5 mm right upper quadrant port were all placed under direct visualization.  Nathanson liver retractor was passed through the epigastrium to retract the left lobe of the liver through a separate stab incision.  The abdomen was explored and she had normal-appearing anatomy.  She did not appear to have any evidence of hiatal hernia.  I identified the pylorus and measured 5 cm from the pylorus and began dividing the short gastric vessels, the lesser sac was entered, and I carried the division of the short gastric vessels around the greater curvature of the stomach up to the spleen into left and to the left crus of the diaphragm.  The stomach was actually fairly easily mobilized off the spleen and after I visualized the left crease and I was confident that the stomach was completely mobilized off the spleen and the diaphragm, I elevated the stomach and divided any lesser sac attachments.  Sharp dissection was used to divide a few thin filmy attachments until the lesser curve vessels were clearly identified,  and I was confident that the stomach was completely mobilized.  I re-measured 5 cm from the pylorus and removed all tubes from the mouth and I first started creating my sleeve with a 60 mm black Tri-Staple load.  The first firing was taken with care taken to avoid narrowing the stomach near the angularis incisura and the second black Tri-Staple load was placed at the crotch of the prior firings in the anticipated direction of firing.  Prior to firing the stapler, a 36-French bougie was passed through the mouth along the lesser curvature of the stomach and was passed without difficulty to  the antrum and pylorus region.  Without re-clamping the stapler, the second firing was taken.  I then transitioned to purple Tri-Staple loads, and continued creating my sleeve towards angle and towards the angle of His. I mobilized the gastric fat pad off the crus and completely divided the stomach and the angle of His with the final firing.  I certified that I was not incorporating any esophagus in the sleeve and the stomach was completely divided.  The staple line appeared hemostatic and the staples all appeared well formed without any firing of the staple line.  There were no dog ears left on the sleeves as well.  At this point, Dr. [name] performed upper endoscopy passing a well-lubricated fiberoptic endoscope through the mouth and esophagus and into the sleeve.  The sleeve appeared very tubular without any spiraling or angulation and it was easily navigated to the pylorus.  There was no evidence of internal bleeding, and the leak test was performed while I had the sleeves emerged under water.  There was no evidence of any air leakage.  The air was suctioned and the scope was removed.  I then inspected the staple line again for hemostasis, which was noted to be adequate and applied fibrin glue along the staple line.  I enlarged the 15-mm port site in order to extract the stomach and this was sent to Pathology for permanent sectioning.  I then replaced my trocar and applied Tisseel fibrin glue, and a 19-French Blake drain was passed and placed just along the sleeve staple line and connected to the left upper quadrant trocar site and sutured in place with a nylon drain stitch.  Again, the abdomen was inspected for hemostasis and was noted to be adequate.  The liver retractor was removed and the stomach extraction site was closed in open fashion with interrupted 0-Vicryl sutures and the sutures were tied and the abdomen was re-insufflated.  There was no evidence of bleeding or bowel  injury and the abdominal wall closure was noted to be adequate without any evidence of bile leak.  The final trocar was removed and the wounds were injected with total of 55 mL of 1% lidocaine with epinephrine and 0.25% Marcaine in 50:50 mixture.  The wound was irrigated with sterile saline solution and skin edges were approximated with 4-0 Monocryl subcuticular suture.  Skin was washed and dried and Dermabond was applied.  All sponge, needle, and instrument counts were correct at the end of the case.  The patient tolerated the procedure well without apparent complications.          ______________________________ Lodema Pilot, MD     BL/MEDQ  D:  01/13/2013  T:  01/14/2013  Job:  [id number]

## 2013-01-14 NOTE — Progress Notes (Signed)
Patient alert and oriented, pain is controlled. Patient is tolerating fluids, plan to advance to protein shake tomorrow.  Reviewed Gastric sleeve discharge instructions with patient and patient is able to articulate understanding. Provided information on BELT program, [facility] Outpatient pharmacy offerings, as well as support group information.    GASTRIC BYPASS / SLEEVE  Home Care Instructions  These instructions are to help you care for yourself when you go home.  Call: If you have any problems.   Call [phone] and ask for the surgeon on call   If you need immediate assistance come to the ER at [facility]. Tell the ER staff that you are a new post-op gastric bypass or gastric sleeve patient   Signs and symptoms to report:   Severe vomiting or nausea o If you cannot handle clear liquids for longer than 1 day, call your surgeon    Abdominal pain which does not get better after taking your pain medication   Fever greater than 100.4 F and chills   Heart rate over 100 beats a minute   Trouble breathing   Chest pain    Redness, swelling, drainage, or foul odor at incision (surgical) sites    If your incisions open or pull apart   Swelling or pain in calf (lower leg)   Diarrhea (Loose bowel movements that happen often), frequent watery, uncontrolled bowel movements   Constipation, (no bowel movements for 3 days) if this happens:  o Take Milk of Magnesia, 2 tablespoons by mouth, 3 times a day for 2 days if needed o Stop taking Milk of Magnesia once you have had a bowel movement o Call your doctor if constipation continues Or o Take Miralax  (instead of Milk of Magnesia) following the label instructions o Stop taking Miralax once you have had a bowel movement o Call your doctor if constipation continues   Anything you think is "abnormal for you"   Normal side effects after surgery:   Unable to sleep at night or unable to concentrate   Irritability   Being tearful (crying) or  depressed These are common complaints, possibly related to your anesthesia, stress of surgery and change in lifestyle, that usually go away a few weeks after surgery.  If these feelings continue, call your medical doctor.  Wound Care: You may have surgical glue, steri-strips, or staples over your incisions after surgery   Surgical glue:  Looks like a clear film over your incisions and will wear off a little at a time   Steri-strips : Adhesive strips of tape over your incisions. You may notice a yellowish color on the skin under the steri-strips. This is used to make the   steri-strips stick better. Do not pull the steri-strips off - let them fall off   Staples: Staples may be removed before you leave the hospital o If you go home with staples, call [facility] at for an appointment with your surgeon's nurse to have staples removed 10 days after surgery, [phone]   Showering: You may shower two (2) days after your surgery unless your surgeon tells you differently o Wash gently around incisions with warm soapy water, rinse well, and gently pat dry  o If you have a drain (tube from your incision), you may need someone to hold this while you shower  o No tub baths until staples are removed and incisions are healed     Medications:   Medications should be liquid or crushed if larger than the size  of a dime   Extended release pills (medication that releases a little bit at a time through the day) should not be crushed   Depending on the size and number of medications you take, you may need to space (take a few throughout the day)/change the time you take your medications so that you do not over-fill your pouch (smaller stomach)   Make sure you follow-up with your primary care physician to make medication changes needed during rapid weight loss and life-style changes   If you have diabetes, follow up with the doctor that orders your diabetes medication(s) within one week after surgery and  check your blood sugar regularly.   Do not drive while taking narcotics (pain medications)   Do not take acetaminophen (Tylenol) and Roxicet or Lortab Elixir at the same time since these pain medications contain acetaminophen  Diet:                    First 2 Weeks  You will see the nutritionist about two (2) weeks after your surgery. The nutritionist will increase the types of foods you can eat if you are handling liquids well:   If you have severe vomiting or nausea and cannot handle clear liquids lasting longer than 1 day, call your surgeon  Protein Shake   Drink at least 2 ounces of shake 5-6 times per day   Each serving of protein shakes (usually 8 - 12 ounces) should have a minimum of:  o 15 grams of protein  o And no more than 5 grams of carbohydrate    Goal for protein each day: o Men = 80 grams per day o Women = 60 grams per day   Protein powder may be added to fluids such as non-fat milk or Lactaid milk or Soy milk (limit to 35 grams added protein powder per serving)  Hydration   Slowly increase the amount of water and other clear liquids as tolerated (See Acceptable Fluids)   Slowly increase the amount of protein shake as tolerated     Sip fluids slowly and throughout the day   May use sugar substitutes in small amounts (no more than 6 - 8 packets per day; i.e. Splenda)  Fluid Goal   The first goal is to drink at least 8 ounces of protein shake/drink per day (or as directed by the nutritionist); some examples of protein shakes are [employer], Dillard's, EAS Edge HP, and Unjury. See handout from pre-op Bariatric Education Class: o Slowly increase the amount of protein shake you drink as tolerated o You may find it easier to slowly sip shakes throughout the day o It is important to get your proteins in first   Your fluid goal is to drink 64 - 100 ounces of fluid daily o It may take a few weeks to build up to this   32 oz (or more) should be clear liquids  And    32  oz (or more) should be full liquids (see below for examples)   Liquids should not contain sugar, caffeine, or carbonation  Clear Liquids:   Water or Sugar-free flavored water (i.e. Fruit H2O, Propel)   Decaffeinated coffee or tea (sugar-free)   Crystal Lite, Wyler's Lite, Minute Maid Lite   Sugar-free Jell-O   Bouillon or broth   Sugar-free Popsicle:   *Less than 20 calories each; Limit 1 per day  Full Liquids: Protein Shakes/Drinks + 2 choices per day of other full liquids  Full liquids must be: o No More Than 12 grams of Carbs per serving  o No More Than 3 grams of Fat per serving   Strained low-fat cream soup   Non-Fat milk   Fat-free Lactaid Milk   Sugar-free yogurt (Dannon Lite & Fit, Greek yogurt)      Vitamins and Minerals   Start 1 day after surgery unless otherwise directed by your surgeon   2 Chewable Multivitamin / Multimineral Supplement with iron (i.e. Centrum for Adults)   Vitamin B-12, 350 - 500 micrograms sub-lingual (place tablet under the tongue) each day   Chewable Calcium Citrate with Vitamin D-3 (Example: 3 Chewable Calcium Plus 600 with Vitamin D-3) o Take 500 mg three (3) times a day for a total of 1500 mg each day o Do not take all 3 doses of calcium at one time as it may cause constipation, and you can only absorb 500 mg  at a time  o Do not mix multivitamins containing iron with calcium supplements; take 2 hours apart o Do not substitute Tums (calcium carbonate) for your calcium   Menstruating women and those at risk for anemia (a blood disease that causes weakness) may need extra iron o Talk with your doctor to see if you need more iron   If you need extra iron: Total daily Iron recommendation (including Vitamins) is 50 to 100 mg Iron/day   Do not stop taking or change any vitamins or minerals until you talk to your nutritionist or surgeon   Your nutritionist and/or surgeon must approve all vitamin and mineral supplements   Activity and Exercise: It  is important to continue walking at home.  Limit your physical activity as instructed by your doctor.  During this time, use these guidelines:   Do not lift anything greater than ten (10) pounds for at least two (2) weeks   Do not go back to work or drive until Designer, industrial/product says you can   You may have sex when you feel comfortable  o It is VERY important for female patients to use a reliable birth control method; fertility often increases after surgery  o Do not get pregnant for at least 18 months   Start exercising as soon as your doctor tells you that you can o Make sure your doctor approves any physical activity   Start with a simple walking program   Walk 5-15 minutes each day, 7 days per week.    Slowly increase until you are walking 30-45 minutes per day Consider joining our BELT program. [phone] or email [email or web address]   Special Instructions Things to remember:   Free counseling is available for you and your family through collaboration between [facility] and [place]. Please call [phone] and leave a message   Use your CPAP when sleeping if this applies to you   [facility] has a free Bariatric Surgery Support Group that meets monthly, the 3rd Thursday, 6 pm, [facility] Classrooms You can see classes online at [email or web address]   It is very important to keep all follow up appointments with your surgeon, nutritionist, primary care physician, and behavioral health practitioner o After the first year, please follow up with your bariatric surgeon and nutritionist at least once a year in order to maintain best weight loss results [facility]: [phone] [facility]: [phone] Bariatric Nurse Coordinator: [phone]

## 2013-01-14 NOTE — Progress Notes (Signed)
1 Day Post-Op  Subjective: Has had some nausea throughout the night but feels better this am. No pain meds other than tylenol last night  Objective: Vital signs in last 24 hours: Temp:  [98.3 F (36.8 C)-99 F (37.2 C)] 98.8 F (37.1 C) (10/21 0437) Pulse Rate:  [58-89] 73 (10/21 0437) Resp:  [14-26] 16 (10/21 0437) BP: (134-152)/(64-97) 137/84 mmHg (10/21 0437) SpO2:  [95 %-100 %] 96 % (10/21 0437) Weight:  [202 lb 9.6 oz (91.899 kg)] 202 lb 9.6 oz (91.899 kg) (10/20 1155) Last BM Date: 01/13/13  Intake/Output from previous day: 10/20 0701 - 10/21 0700 In: 2360 [I.V.:2260; IV Piggyback:100] Out: 1915 [Urine:1800; Drains:115] Intake/Output this shift:    General appearance: alert, cooperative and no distress Resp: clear to auscultation bilaterally Cardio: normal rate, regular GI: soft, appropriate incisional tenderness, minimal tenderness, ND, JP thin ss Extremities: SCD's bilat  Lab Results:   Recent Labs  01/14/13 0524  WBC 10.9*  HGB 13.9  HCT 40.5  PLT 236   BMET  Recent Labs  01/14/13 0524  NA 131*  K 3.7  CL 97  CO2 22  GLUCOSE 167*  BUN 7  CREATININE 0.54  CALCIUM 9.2   PT/INR No results found for this basename: LABPROT, INR,  in the last 72 hours ABG No results found for this basename: PHART, PCO2, PO2, HCO3,  in the last 72 hours  Studies/Results: No results found.  Anti-infectives: Anti-infectives   Start     Dose/Rate Route Frequency Ordered Stop   01/13/13 1200  ertapenem (INVANZ) 1 g in sodium chloride 0.9 % 50 mL IVPB     1 g 100 mL/hr over 30 Minutes Intravenous  Once 01/13/13 1156 01/13/13 1525      Assessment/Plan: s/p Procedure(s): LAPAROSCOPIC GASTRIC SLEEVE RESECTION (N/A) UPPER GI ENDOSCOPY she looks okay this morning.  she has had nausea consistent with her prior surgeries but improved this am.  no evidence of postop complications.  will try to advance diet as tolerated. continue to mobilize   LOS: 1 day    Lodema Pilot DAVID 01/14/2013

## 2013-01-14 NOTE — Progress Notes (Signed)
Doing okay but having nausea.  HD stable.  Abdomen appropriate.  She has a history of postop nausea and this is likely a result of that.  Hopefully this will improve shortly.

## 2013-01-14 NOTE — Progress Notes (Signed)
Patient complains of heartburn.  Dr Derrell Lolling paged.  Orders received via OR nurse by Dr Derrell Lolling to begin patient on IV Protonix daily.  First dose given.

## 2013-01-14 NOTE — Progress Notes (Signed)
Ur review performed.  

## 2013-01-14 NOTE — Progress Notes (Signed)
Notified Dr. Biagio Quint regarding pt vomiting 30-50 cc of brown yellow liquid.  Dr. Biagio Quint did not want to order upper GI for patient. Ordered bariatric clears.  Read back and confirmed.

## 2013-01-15 MED ORDER — OXYCODONE HCL 5 MG/5ML PO SOLN: 5.0000 mg | ORAL | Status: DC | PRN

## 2013-01-15 MED ORDER — ONDANSETRON 4 MG PO TBDP: 4.0000 mg | ORAL_TABLET | Freq: Three times a day (TID) | ORAL | Status: DC | PRN

## 2013-01-15 NOTE — Progress Notes (Signed)
At 0820, pt rated pain a 5/10.  Gave 30 mg Toradol. Pt tolerated bariatric clear breakfast tray and scheduled protein shake with no reported nausea.

## 2013-01-15 NOTE — Progress Notes (Signed)
She is doing well with liquids.  Abdomen looks good.  Should be okay for discharge to home.

## 2013-01-15 NOTE — Progress Notes (Signed)
2 Days Post-Op  Subjective: Nausea resolved. Now taking liquids without difficulty. Not taking any pain meds  Objective: Vital signs in last 24 hours: Temp:  [98.8 F (37.1 C)-99.7 F (37.6 C)] 98.8 F (37.1 C) (10/22 0520) Pulse Rate:  [52-68] 58 (10/22 0520) Resp:  [16-18] 18 (10/22 0520) BP: (129-163)/(65-92) 156/78 mmHg (10/22 0520) SpO2:  [97 %-98 %] 97 % (10/22 0520) Last BM Date: 01/13/13  Intake/Output from previous day: 10/21 0701 - 10/22 0700 In: 3611.4 [P.O.:426; I.V.:3185.4] Out: 2915 [Urine:2875; Drains:40] Intake/Output this shift: Total I/O In: -  Out: 500 [Urine:500]  General appearance: alert, cooperative and no distress Resp: clear to auscultation bilaterally Cardio: normal rate, regular GI: soft, minimal tenderness, ND, wounds okay, JP thin serous Extremities: SCD's bilat  Lab Results:   Recent Labs  01/14/13 0524  WBC 10.9*  HGB 13.9  HCT 40.5  PLT 236   BMET  Recent Labs  01/14/13 0524  NA 131*  K 3.7  CL 97  CO2 22  GLUCOSE 167*  BUN 7  CREATININE 0.54  CALCIUM 9.2   PT/INR No results found for this basename: LABPROT, INR,  in the last 72 hours ABG No results found for this basename: PHART, PCO2, PO2, HCO3,  in the last 72 hours  Studies/Results: No results found.  Anti-infectives: Anti-infectives   Start     Dose/Rate Route Frequency Ordered Stop   01/13/13 1200  ertapenem (INVANZ) 1 g in sodium chloride 0.9 % 50 mL IVPB     1 g 100 mL/hr over 30 Minutes Intravenous  Once 01/13/13 1156 01/13/13 1525      Assessment/Plan: s/p Procedure(s): LAPAROSCOPIC GASTRIC SLEEVE RESECTION (N/A) UPPER GI ENDOSCOPY advance diet, mobilize.  she will likely be ready for discharge to home later today or tomorrow.  LOS: 2 days    Lodema Pilot DAVID 01/15/2013

## 2013-01-16 NOTE — Discharge Summary (Signed)
Physician Discharge Summary  Patient ID: Kayla Cooley MRN: [id number] DOB/AGE: [birth year] 42 y.o.  Admit date: 01/13/2013 Discharge date: 01/15/13  Admission Diagnoses: obesity  Discharge Diagnoses: same Active Problems:   * No active hospital problems. *   Discharged Condition: stable  Hospital Course: to OR 01/13/13 for sleeve gastrectomy.  Nausea on POD 1 but able to advance diet.  She tolerated this well and on POD 2 nausea resolved.  She was stable for discharge to home on POD 2.  Consults: None  Significant Diagnostic Studies: none  Treatments: surgery: 01/13/13 sleeve gastrectomy  Disposition: 01-Home or Self Care  Discharge Orders   Future Appointments Provider Department Dept Phone   01/30/2013 2:15 PM Lodema Pilot, DO [facility], [place] [phone]   Future Orders Complete By Expires   Call MD for:  difficulty breathing, headache or visual disturbances  As directed    Call MD for:  persistant dizziness or light-headedness  As directed    Call MD for:  persistant nausea and vomiting  As directed    Call MD for:  redness, tenderness, or signs of infection (pain, swelling, redness, odor or green/yellow discharge around incision site)  As directed    Call MD for:  severe uncontrolled pain  As directed    Call MD for:  temperature >100.4  As directed    Discharge instructions  As directed    Comments:     May shower tomorrow.   Call [phone] for follow up appointment with Biagio Quint in 3 weeks. May start vitamins and protein supplements. May start full liquid diet tomorrow x1 week, then pureed diet x1 week, then soft diet x1 week, then advance to high protein, low fat, low carb diet  Increase activity as tolerated. Drink plenty of fluids. Crush all meds or take liquid meds x4 weeks.   Increase activity slowly  As directed        Medication List         cetirizine 10 MG tablet  Commonly known as:  ZYRTEC  Take 10 mg by mouth every morning.     clobetasol cream 0.05 %  Commonly known as:  TEMOVATE  Apply 1 application topically daily as needed (For eczema.).     metoprolol tartrate 25 MG tablet  Commonly known as:  LOPRESSOR  Take 25 mg by mouth daily.     ondansetron 4 MG disintegrating tablet  Commonly known as:  ZOFRAN ODT  Take 1 tablet (4 mg total) by mouth every 8 (eight) hours as needed for nausea.     oxyCODONE 5 MG/5ML solution  Commonly known as:  ROXICODONE  Take 5 mLs (5 mg total) by mouth every 4 (four) hours as needed for pain.     prenatal multivitamin Tabs tablet  Take 1 tablet by mouth every morning.         SignedLodema Pilot DAVID 01/16/2013, 9:57 AM

## 2013-01-30 ENCOUNTER — Other Ambulatory Visit: Payer: Self-pay

## 2013-01-30 ENCOUNTER — Encounter (INDEPENDENT_AMBULATORY_CARE_PROVIDER_SITE_OTHER): Payer: Self-pay | Admitting: General Surgery

## 2013-01-30 ENCOUNTER — Ambulatory Visit (INDEPENDENT_AMBULATORY_CARE_PROVIDER_SITE_OTHER): Payer: Managed Care, Other (non HMO) | Admitting: General Surgery

## 2013-01-30 VITALS — BP 122/68 | HR 71 | Temp 97.0°F | Resp 16 | Ht 61.0 in | Wt 194.4 lb

## 2013-01-30 DIAGNOSIS — Z5189 Encounter for other specified aftercare: Secondary | ICD-10-CM

## 2013-01-30 DIAGNOSIS — Z4889 Encounter for other specified surgical aftercare: Secondary | ICD-10-CM

## 2013-01-30 NOTE — Progress Notes (Signed)
Subjective:     Patient ID: Kayla Cooley, female   DOB: Feb 27, 1971, 41 y.o.   MRN: [id number]  HPI This patient follows up 3 weeks status post vertical sleeve gastrectomy for obesity. She has lost about 10 pounds since her procedure she did lose several pounds on a preoperative diet.  She says that she feels well and has no complaints. She has not taken any pain medication since her procedure. She has no food intolerance and is not having any reflux. She is taking her protein supplements and vitamin supplements and she feels well. She is exercising frequently and has no complaints. No numbness or tingling or nausea vomiting  Review of Systems     Objective:   Physical Exam She is in no acute distress and nontoxic Her abdomen is soft and nontender on exam her incisions are healing nicely There is no evidence of any neurologic deficits    Assessment:     Status post vertical sleeve gastrectomy She feels well and has no evidence of any postoperative complications. She has only lost about 10 pounds since her procedure and she seems to be exercising and eating appropriately. I think that she is doing okay although would like to see her to some additional weight. She is starting from a much smaller weight to begin with hence I think her absolute weight loss will not be comparable to others. I recommended that she continue with her exercise and gradually increased her diet as instructed. She continues with her protein and vitamins.       Plan:     Continue with exercise in bariatric diet Follow up in one month. We will see her back a little bit sooner than usual in order to make sure that she continues to lose weight appropriately.   We Will refer her to our nutritionist for additional help with her weight loss

## 2013-02-11 ENCOUNTER — Encounter: Payer: Managed Care, Other (non HMO) | Attending: General Surgery | Admitting: Dietician

## 2013-02-11 ENCOUNTER — Encounter: Payer: Self-pay | Admitting: Dietician

## 2013-02-11 VITALS — Ht 61.0 in | Wt 192.5 lb

## 2013-02-11 DIAGNOSIS — Z713 Dietary counseling and surveillance: Secondary | ICD-10-CM | POA: Insufficient documentation

## 2013-02-11 DIAGNOSIS — E669 Obesity, unspecified: Secondary | ICD-10-CM

## 2013-02-11 NOTE — Progress Notes (Signed)
  Follow-up visit:  4 Weeks Post-Operative Sleeve Gastrectomy Surgery  Medical Nutrition Therapy:  Appt start time: 1645 end time:  1745.  Primary concerns today: Post-operative Bariatric Surgery Nutrition Management. Kayla Cooley had surgery on 01/13/13, after having all of her initial work up before surgery at [facility]. States she has lost 11 lbs since surgery.  Starting weight: 227 lbs around 08/2012  Surgery Type: Sleeve Gastectomy Surgery Date: 01/13/13 Weight today: 192.5 lbs Weight lost: 34.5 lbs total  Goal weight: 140 lbs  TANITA  BODY COMP RESULTS  02/11/13   BMI (kg/m^2) 36.4   Fat Mass (lbs) 84.5   Fat Free Mass (lbs) 108.0   Total Body Water (lbs) 79.0    Preferred Learning Style:   No preference indicated   Learning Readiness:   Change in progress  24-hr recall: B (AM): protein shake (body tech) with water (25g protein) Snk (10 AM): cheese stick (5g)  L (PM): 1/4 Joseph's flatbread (low carb) with cheese and [place] (10 g) Snk (PM): [place] yogurt light and fit (12 g + PB2 5g)  D (PM): chicken stew (vegetables), chili, chicken breast with green beans (15-20g) Snk (PM): cheerios with protein, diet hot cocoa   Fluid intake: about 68 oz Estimated total protein intake: 70 g +  Medications: see list Supplementation: Taking  Using straws: No Drinking while eating: No Hair loss: No Carbonated beverages: No N/V/D/C: some constipation Dumping syndrome: No  Recent physical activity:  Walking on a treadmill for 45 minutes with intervals 6 x week   Progress Towards Goal(s):  In progress.  Handouts given during visit include:  Phase IIIA    Nutritional Diagnosis:  [place]-3.3 Overweight/obesity related to past poor dietary habits and physical inactivity as evidenced by patient w/ recent Sleeve Gastrectomy surgery following dietary guidelines for continued weight loss.    Intervention:  Nutrition eduction/diet advancement  Teaching Method  Utilized: Visual Auditory  Barriers to learning/adherence to lifestyle change: none  Demonstrated degree of understanding via:  Teach Back   Monitoring/Evaluation:  Dietary intake, exercise, lap band fills, and body weight. Follow up in 2 months for 3 month post-op visit.

## 2013-02-11 NOTE — Patient Instructions (Signed)
Goals:  Follow Phase IIIA for the next two weeks, then:   Follow Phase 3B: High Protein + Non-Starchy Vegetables  Eat 3-6 small meals/snacks, every 3-5 hrs  Increase lean protein foods to meet 60g goal  Increase fluid intake to 64oz +  Avoid drinking 15 minutes before, during and 30 minutes after eating  Aim for >30 min of physical activity daily

## 2013-02-27 ENCOUNTER — Ambulatory Visit (INDEPENDENT_AMBULATORY_CARE_PROVIDER_SITE_OTHER): Payer: Managed Care, Other (non HMO) | Admitting: General Surgery

## 2013-02-27 ENCOUNTER — Encounter (INDEPENDENT_AMBULATORY_CARE_PROVIDER_SITE_OTHER): Payer: Self-pay | Admitting: General Surgery

## 2013-02-27 VITALS — BP 112/84 | HR 56 | Temp 97.6°F | Resp 16 | Ht 61.0 in | Wt 185.8 lb

## 2013-02-27 DIAGNOSIS — Z5189 Encounter for other specified aftercare: Secondary | ICD-10-CM

## 2013-02-27 DIAGNOSIS — Z4889 Encounter for other specified surgical aftercare: Secondary | ICD-10-CM

## 2013-02-27 NOTE — Progress Notes (Signed)
Subjective:     Patient ID: Kayla Cooley, female   DOB: Apr 17, 1970, 42 y.o.   MRN: [id number]  HPI This patient comes in today for followup status post vertical sleeve gastrectomy. Her surgery was in October and I want to see her back because of her slower than expected weight loss. She has lost about 9 pounds since her last visit and says that she feels great. She has not had any vomiting or nausea and denies any abdominal pain. She does have some constipation which she treats with over-the-counter stool softeners occasionally. She is taking her protein or vitamin and she says that she is walking about 40 minutes daily. She says that she still feels hungry and is snacking frequently but it sounds as though she is eating fairly healthy snacks. She would like to get down to 140 pounds  Review of Systems     Objective:   Physical Exam No distress and nontoxic-appearing Her abdomen is soft nontender exam her incisions are healing nicely without any sign of infection    Assessment:     Status post vertical sleeve gastrectomy She has lost about 18 pounds since her surgery and she is a little bit more difficult to judge with regard to her weight loss because she is starting from low weight. She only has to lose about 40 more pass to get to her goal which I think is certainly doable. We have set at goal for about 10 pounds per month weight loss and we discussed some of the nutritional and exercise activities in order to get her to the goal. It sounds like she is eating well and making some pretty good food choices and I think that she will continue to lose weight appropriately. I really don't have many more recommendations for her     Plan:     followup in 2 months for nutrition labs Continuous bariatric diet and increase activity as tolerated to maximize weight loss

## 2013-04-08 ENCOUNTER — Encounter: Payer: Managed Care, Other (non HMO) | Attending: Surgery | Admitting: Dietician

## 2013-04-08 VITALS — Ht 61.0 in | Wt 174.0 lb

## 2013-04-08 DIAGNOSIS — Z9884 Bariatric surgery status: Secondary | ICD-10-CM | POA: Insufficient documentation

## 2013-04-08 DIAGNOSIS — E669 Obesity, unspecified: Secondary | ICD-10-CM | POA: Insufficient documentation

## 2013-04-08 DIAGNOSIS — Z713 Dietary counseling and surveillance: Secondary | ICD-10-CM | POA: Insufficient documentation

## 2013-04-08 DIAGNOSIS — Z6832 Body mass index (BMI) 32.0-32.9, adult: Secondary | ICD-10-CM | POA: Insufficient documentation

## 2013-04-08 NOTE — Progress Notes (Addendum)
  Follow-up visit: 12 Weeks Post-Operative Sleeve Gastrectomy Surgery  Medical Nutrition Therapy:  Appt start time: 845 end time:  915. Kayla BradfordKimberly returns for a follow up today with an 18.5 lbs weight loss. Working out about 6-7 days per for 55 minutes. Ate some cookies and goldfish over the holidays and had some alcohol (vodka) with Crystal Light occasionally. Overall doing well even with some "cheating".   Surgery Type: Sleeve Gastectomy Surgery Date: 01/13/13 Weight today: 174.0 lbs  Weight lost since last visit: 18.5 Weight lost: 53.0 lbs total  Goal weight: 140 lbs  TANITA  BODY COMP RESULTS  02/11/13 04/08/13   BMI (kg/m^2) 36.4 32.9   Fat Mass (lbs) 84.5 73.5   Fat Free Mass (lbs) 108.0 100.5   Total Body Water (lbs) 79.0 73.5    Preferred Learning Style:   No preference indicated   Learning Readiness:   Change in progress  24-hr recall: B (AM): protein shake (body tech) with water (25g protein) Snk (10 AM): [place] Light and Fit Yogurt (12g)  L (PM): 1/4 Joseph's flatbread (low carb) with cheese and low sodium ham (10 g) Snk (PM): fiber one protein bar - (7 gram protein) D (PM): 3.5 - 4.0 beef or chicken (scrambled) and 45 calorie slice of cheese (15-20g) with green beans or salad Snk (PM): 1/2 cup sugar free ice cream with PB2 or Quest bar (21 g)  Fluid intake: about 68-80 oz - decaf coffee 1-2 x day, water Estimated total protein intake: 70 g +  Medications: see list Supplementation: Taking MVI and B12, stopped calcium   Using straws: No Drinking while eating: No Hair loss: a little bit Carbonated beverages: No N/V/D/C: some constipation, takes colace 4 x week Dumping syndrome: No  Recent physical activity:  Walking or elliptical for 55 minutes with intervals 6-7 x week and started added bowflex 3 x week  Progress Towards Goal(s):  In progress.  Nutritional Diagnosis:  [place]-3.3 Overweight/obesity related to past poor dietary habits and physical inactivity as  evidenced by patient w/ recent Sleeve Gastrectomy surgery following dietary guidelines for continued weight loss.    Intervention:  Nutrition eduction/diet advancement  Teaching Method Utilized: Visual Auditory  Barriers to learning/adherence to lifestyle change: none  Demonstrated degree of understanding via:  Teach Back   Monitoring/Evaluation:  Dietary intake, exercise, lap band fills, and body weight. Follow up in 3 months for 6 month post-op visit.

## 2013-04-08 NOTE — Patient Instructions (Signed)
Goals:  Follow Phase 3B: High Protein + Non-Starchy Vegetables  Eat 3-6 small meals/snacks, every 3-5 hrs  Continue to eat lean protein foods to meet 60g goal  Increase fluid intake to 64 oz - 80 oz per week  Avoid drinking 15 minutes before, during and 30 minutes after eating  Continue to get 55 minutes of physical activity (almost) daily  Consider adding some Citracal petites for calcium   Consider adding more vegetables to lunch and dinner (have less protein if needed)  [place]!!

## 2013-04-16 ENCOUNTER — Encounter (INDEPENDENT_AMBULATORY_CARE_PROVIDER_SITE_OTHER): Payer: Self-pay | Admitting: General Surgery

## 2013-05-29 ENCOUNTER — Encounter (INDEPENDENT_AMBULATORY_CARE_PROVIDER_SITE_OTHER): Payer: Self-pay | Admitting: General Surgery

## 2013-05-29 ENCOUNTER — Ambulatory Visit (INDEPENDENT_AMBULATORY_CARE_PROVIDER_SITE_OTHER): Payer: Managed Care, Other (non HMO) | Admitting: General Surgery

## 2013-05-29 VITALS — BP 130/82 | HR 70 | Temp 98.6°F | Resp 14 | Ht 61.0 in | Wt 167.0 lb

## 2013-05-29 DIAGNOSIS — E669 Obesity, unspecified: Secondary | ICD-10-CM

## 2013-05-29 DIAGNOSIS — Z9884 Bariatric surgery status: Secondary | ICD-10-CM

## 2013-05-29 NOTE — Progress Notes (Signed)
Subjective:     Patient ID: Esmeralda ArthurKimberly Jun, female   DOB: 11/23/1970, 43 y.o.   MRN: [id number]  HPI [age] year old obese Caucasian female status post laparoscopic sleeve gastrectomy by Dr. Biagio QuintLayton on 01/13/2013 comes in for followup. She was last seen in the office on December 4 at which time her weight was 185.9 pounds. She states that she has been doing well. She denies any nausea, vomiting, heartburn, abdominal pain. She does have some mild constipation. She also reports some mild hair loss. She is taking her supplements as directed. She reports more energy. She still has some hunger at times. However it takes less food to satisfy her hunger. She was exercising quite regularly specifically daily but has now developed a right knee problem and has seen her orthopedic surgeon who put her on an every other day exercise regimen. She is exercising 15 minutes at a time and doing a circuit consisting of bowflex, treadmill, an elliptical. She is using MyFitnessPal and a water tracking app.  She states she is averaging 90 grams of protein a day.  She states that she does not take her Nexium she will have a gassy feeling. She is still taking her blood pressure medicine  PMHx, PSHx, SOCHx, FAMHx, ALL reviewed and unchanged  Review of Systems 10 point ROS performed and negative except for HPI    Objective:   Physical Exam BP 130/82  Pulse 70  Temp(Src) 98.6 F (37 C) (Oral)  Resp 14  Ht 5\' 1"  (1.549 m)  Wt 167 lb (75.751 kg)  BMI 31.57 kg/m2  Gen: alert, NAD, non-toxic appearing Pupils: equal, no scleral icterus Pulm: Lungs clear to auscultation, symmetric chest rise CV: regular rate and rhythm Abd: soft, nontender, nondistended. Well-healed trocar sites. No cellulitis. No incisional hernia Ext: no edema, no calf tenderness Skin: no rash, no jaundice     Assessment:     S/p lap sleeve gastrectomy 01/13/13 Obesity HTN - improved GERD - improved     Plan:     Overall she is doing well.  Her initial visit we to our office last summer was 216 pounds. Her immediate preoperative weight was 204 pounds. I congratulated her on her weight loss. We discussed the importance of ongoing compliance with the supplements, Proper food choices, and exercise.  We also discussed good eating techniques and behaviors. We discussed other exercise modalities for her right knee pain such as a recumbent bicycle, elliptical, or swimming. I encouraged her to do more elliptical than treadmill since that should be less stress on her knee. She is due for routine lab surveillance and we will order surveillance labs.  F/u 3 months  Mary SellaEric M. Andrey CampanileWilson, MD, FACS General, Bariatric, & Minimally Invasive Surgery [facility], [place]

## 2013-05-29 NOTE — Patient Instructions (Signed)
Please get your labs drawn  Eating techniques 20-20-20 (30-30-30) 20 chews, 20 seconds between bites of food, 20 minutes to eat; sometimes you may need 30 chews, 30 seconds etc Use your nondominant hand to eat with Put fork down between bites of food Use a timer after swallowing to reinforce waiting 20-30 sec between bites of food Use a child/infant size utensil Try not to eat while watching TV

## 2013-06-05 ENCOUNTER — Other Ambulatory Visit (INDEPENDENT_AMBULATORY_CARE_PROVIDER_SITE_OTHER): Payer: Self-pay | Admitting: General Surgery

## 2013-06-05 LAB — CMP AND LIVER
ALT: 21 U/L (ref 0–35)
AST: 16 U/L (ref 0–37)
Albumin: 3.9 g/dL (ref 3.5–5.2)
Alkaline Phosphatase: 48 U/L (ref 39–117)
BILIRUBIN DIRECT: 0.1 mg/dL (ref 0.0–0.3)
BUN: 13 mg/dL (ref 6–23)
CALCIUM: 8.7 mg/dL (ref 8.4–10.5)
CHLORIDE: 103 meq/L (ref 96–112)
CO2: 25 mEq/L (ref 19–32)
Creat: 0.58 mg/dL (ref 0.50–1.10)
Glucose, Bld: 84 mg/dL (ref 70–99)
Indirect Bilirubin: 0.4 mg/dL (ref 0.2–1.2)
Potassium: 4 mEq/L (ref 3.5–5.3)
Sodium: 138 mEq/L (ref 135–145)
Total Bilirubin: 0.5 mg/dL (ref 0.2–1.2)
Total Protein: 6.8 g/dL (ref 6.0–8.3)

## 2013-06-05 LAB — CBC WITH DIFFERENTIAL/PLATELET
Basophils Absolute: 0 10*3/uL (ref 0.0–0.1)
Basophils Relative: 1 % (ref 0–1)
EOS ABS: 0 10*3/uL (ref 0.0–0.7)
EOS PCT: 1 % (ref 0–5)
HEMATOCRIT: 41.2 % (ref 36.0–46.0)
HEMOGLOBIN: 13.9 g/dL (ref 12.0–15.0)
Lymphocytes Relative: 23 % (ref 12–46)
Lymphs Abs: 1 10*3/uL (ref 0.7–4.0)
MCH: 29.9 pg (ref 26.0–34.0)
MCHC: 33.7 g/dL (ref 30.0–36.0)
MCV: 88.6 fL (ref 78.0–100.0)
MONO ABS: 0.3 10*3/uL (ref 0.1–1.0)
MONOS PCT: 7 % (ref 3–12)
Neutro Abs: 2.9 10*3/uL (ref 1.7–7.7)
Neutrophils Relative %: 68 % (ref 43–77)
Platelets: 200 10*3/uL (ref 150–400)
RBC: 4.65 MIL/uL (ref 3.87–5.11)
RDW: 14 % (ref 11.5–15.5)
WBC: 4.2 10*3/uL (ref 4.0–10.5)

## 2013-06-05 LAB — LIPID PANEL
CHOLESTEROL: 162 mg/dL (ref 0–200)
HDL: 50 mg/dL (ref >39)
LDL Cholesterol: 96 mg/dL (ref 0–99)
TRIGLYCERIDES: 81 mg/dL (ref <150)
Total CHOL/HDL Ratio: 3.2 Ratio
VLDL: 16 mg/dL (ref 0–40)

## 2013-06-05 LAB — FOLATE: Folate: >20 ng/mL

## 2013-06-05 LAB — IRON AND TIBC
%SAT: 33 % (ref 20–55)
IRON: 95 ug/dL (ref 42–145)
TIBC: 291 ug/dL (ref 250–470)
UIBC: 196 ug/dL (ref 125–400)

## 2013-06-05 LAB — VITAMIN D 25 HYDROXY (VIT D DEFICIENCY, FRACTURES): Vit D, 25-Hydroxy: 68 ng/mL (ref 30–89)

## 2013-06-05 LAB — MAGNESIUM: Magnesium: 1.8 mg/dL (ref 1.5–2.5)

## 2013-06-05 LAB — VITAMIN B12: Vitamin B-12: 601 pg/mL (ref 211–911)

## 2013-07-07 ENCOUNTER — Ambulatory Visit: Payer: Managed Care, Other (non HMO) | Admitting: Dietician

## 2013-10-09 ENCOUNTER — Ambulatory Visit (INDEPENDENT_AMBULATORY_CARE_PROVIDER_SITE_OTHER): Payer: Managed Care, Other (non HMO) | Admitting: General Surgery

## 2013-10-09 ENCOUNTER — Encounter (INDEPENDENT_AMBULATORY_CARE_PROVIDER_SITE_OTHER): Payer: Self-pay | Admitting: General Surgery

## 2013-10-09 VITALS — BP 126/80 | HR 76 | Temp 98.1°F | Ht 61.0 in | Wt 142.2 lb

## 2013-10-09 DIAGNOSIS — Z9884 Bariatric surgery status: Secondary | ICD-10-CM

## 2013-10-09 DIAGNOSIS — E663 Overweight: Secondary | ICD-10-CM

## 2013-10-09 NOTE — Progress Notes (Signed)
Subjective:     Patient ID: Kayla Cooley, female   DOB: December 22, 1970, 43 y.o.   MRN: [id number]  HPI 43 year old obese Caucasian female status post laparoscopic sleeve gastrectomy by Dr. Biagio QuintLayton on 01/13/2013 comes in for followup. She was last seen in the office on march 5 at which time her weight was 167 pounds. She states that she has been doing well. She denies any nausea, vomiting, heartburn, abdominal pain. She does have some mild constipation. She also reports that the hair loss has subsided and started to regrow but she is going to get a short haircut tonight.  She is taking her supplements as directed. She reports more energy. She reports less hunger. She is eating low carb foods and for sweets she is eating quest protein bar.  She is still exercising quite regularly specifically M/W/F and just cardio on sat. She is exercising 15 minutes at a time and doing a circuit consisting of bowflex, treadmill, an elliptical (HITT). She is using MyFitnessPal and a water tracking app.  She states she is averaging 90 grams of protein a day.  She states that she does not take her Nexium she will have a gassy feeling. She is still taking her blood pressure medicine  PMHx, PSHx, SOCHx, FAMHx, ALL reviewed and unchanged  Review of Systems 10 point ROS performed and negative except for HPI    Objective:   Physical Exam BP 126/80  Pulse 76  Temp(Src) 98.1 F (36.7 C)  Ht 5\' 1"  (1.549 m)  Wt 142 lb 3.2 oz (64.501 kg)  BMI 26.88 kg/m2  Gen: alert, NAD, non-toxic appearing Pupils: equal, no scleral icterus Pulm: Lungs clear to auscultation, symmetric chest rise CV: regular rate and rhythm Abd: soft, nontender, nondistended. Well-healed trocar sites. No cellulitis. No incisional hernia Ext: no edema, no calf tenderness Skin: no rash, no jaundice; redundant skin     Assessment:     S/p lap sleeve gastrectomy 01/13/13 Obesity HTN - improved GERD - improved     Plan:     Overall she is doing  well. Her initial visit we to our office last summer was 216 pounds. Her immediate preoperative weight was 204 pounds. I congratulated her on her weight loss. We discussed the importance of ongoing compliance with the supplements, Proper food choices, and exercise.  We also discussed good eating techniques and behaviors. I congratulated her on her commitment to exercise and stressed the importance of continuing it for long term success with her weight loss. We discussed the lab work that she had done in March as part of her surveillance labs. Her comprehensive metabolic panel, CBC, vitamin D, folate, vitamin B 12 levels were all normal. Her lipid panel was normal. Preoperatively her triglyceride level was 196. Now her triglyceride level is 81. Followup with me in 3 months  Mary Sellaric M. Andrey CampanileWilson, MD, FACS General, Bariatric, & Minimally Invasive Surgery [facility] [place]   F/u 3 months  Mary Sellaric M. Andrey CampanileWilson, MD, FACS General, Bariatric, & Minimally Invasive Surgery [facility], [place]

## 2014-01-22 ENCOUNTER — Other Ambulatory Visit (INDEPENDENT_AMBULATORY_CARE_PROVIDER_SITE_OTHER): Payer: Self-pay | Admitting: General Surgery

## 2014-01-22 DIAGNOSIS — E663 Overweight: Secondary | ICD-10-CM

## 2014-01-22 DIAGNOSIS — Z903 Acquired absence of stomach [part of]: Secondary | ICD-10-CM

## 2014-02-03 ENCOUNTER — Telehealth (INDEPENDENT_AMBULATORY_CARE_PROVIDER_SITE_OTHER): Payer: Self-pay

## 2014-02-03 NOTE — Telephone Encounter (Signed)
Pt called to request lab report from 10/29.  The results were not in Epic.  We contacted lab and obtained results 11/09.  Please review. Thanks. Nettie ElmSylvia

## 2015-01-22 ENCOUNTER — Other Ambulatory Visit: Payer: Self-pay | Admitting: General Surgery

## 2015-01-22 DIAGNOSIS — K219 Gastro-esophageal reflux disease without esophagitis: Secondary | ICD-10-CM

## 2015-01-28 ENCOUNTER — Ambulatory Visit
Admission: RE | Admit: 2015-01-28 | Discharge: 2015-01-28 | Disposition: A | Discharge status: Home / Self Care | Payer: Managed Care, Other (non HMO) | Source: Ambulatory Visit | Attending: General Surgery | Admitting: General Surgery

## 2015-01-28 DIAGNOSIS — K219 Gastro-esophageal reflux disease without esophagitis: Secondary | ICD-10-CM

## 2015-02-09 ENCOUNTER — Other Ambulatory Visit: Payer: Self-pay | Admitting: General Surgery

## 2015-02-09 DIAGNOSIS — K219 Gastro-esophageal reflux disease without esophagitis: Secondary | ICD-10-CM

## 2015-02-15 ENCOUNTER — Ambulatory Visit
Admission: RE | Admit: 2015-02-15 | Discharge: 2015-02-15 | Disposition: A | Discharge status: Home / Self Care | Payer: Managed Care, Other (non HMO) | Source: Ambulatory Visit | Attending: General Surgery | Admitting: General Surgery

## 2015-02-15 DIAGNOSIS — K219 Gastro-esophageal reflux disease without esophagitis: Secondary | ICD-10-CM

## 2016-04-11 ENCOUNTER — Ambulatory Visit: Payer: Self-pay | Admitting: General Surgery

## 2016-05-01 ENCOUNTER — Ambulatory Visit (HOSPITAL_COMMUNITY): Payer: Managed Care, Other (non HMO) | Admitting: Anesthesiology

## 2016-05-01 ENCOUNTER — Ambulatory Visit (HOSPITAL_COMMUNITY)
Admission: RE | Admit: 2016-05-01 | Discharge: 2016-05-01 | Disposition: A | Discharge status: Home / Self Care | Payer: Managed Care, Other (non HMO) | Source: Ambulatory Visit | Attending: General Surgery | Admitting: General Surgery

## 2016-05-01 ENCOUNTER — Encounter (HOSPITAL_COMMUNITY): Payer: Self-pay

## 2016-05-01 ENCOUNTER — Encounter (HOSPITAL_COMMUNITY)
Admission: RE | Disposition: A | Discharge status: Home / Self Care | Payer: Self-pay | Source: Ambulatory Visit | Attending: General Surgery

## 2016-05-01 DIAGNOSIS — I10 Essential (primary) hypertension: Secondary | ICD-10-CM | POA: Diagnosis not present

## 2016-05-01 DIAGNOSIS — Z9884 Bariatric surgery status: Secondary | ICD-10-CM | POA: Diagnosis not present

## 2016-05-01 DIAGNOSIS — Z96652 Presence of left artificial knee joint: Secondary | ICD-10-CM | POA: Insufficient documentation

## 2016-05-01 DIAGNOSIS — Z6831 Body mass index (BMI) 31.0-31.9, adult: Secondary | ICD-10-CM | POA: Diagnosis not present

## 2016-05-01 DIAGNOSIS — E669 Obesity, unspecified: Secondary | ICD-10-CM | POA: Diagnosis not present

## 2016-05-01 DIAGNOSIS — K219 Gastro-esophageal reflux disease without esophagitis: Secondary | ICD-10-CM | POA: Diagnosis present

## 2016-05-01 DIAGNOSIS — Z79899 Other long term (current) drug therapy: Secondary | ICD-10-CM | POA: Diagnosis not present

## 2016-05-01 DIAGNOSIS — K210 Gastro-esophageal reflux disease with esophagitis: Secondary | ICD-10-CM | POA: Diagnosis not present

## 2016-05-01 HISTORY — PX: ESOPHAGOGASTRODUODENOSCOPY (EGD) WITH PROPOFOL: SHX5813

## 2016-05-01 SURGERY — ESOPHAGOGASTRODUODENOSCOPY (EGD) WITH PROPOFOL
Anesthesia: Monitor Anesthesia Care

## 2016-05-01 MED ORDER — LACTATED RINGERS IV SOLN
INTRAVENOUS | Status: DC
  Administered 2016-05-01: 1000 mL via INTRAVENOUS

## 2016-05-01 MED ORDER — PROPOFOL 10 MG/ML IV BOLUS
INTRAVENOUS | Status: AC
  Filled 2016-05-01: qty 20

## 2016-05-01 MED ORDER — PROPOFOL 500 MG/50ML IV EMUL
INTRAVENOUS | Status: DC | PRN
  Administered 2016-05-01: 75 ug/kg/min via INTRAVENOUS

## 2016-05-01 MED ORDER — CHLORHEXIDINE GLUCONATE CLOTH 2 % EX PADS: 6.0000 | MEDICATED_PAD | Freq: Once | CUTANEOUS | Status: DC

## 2016-05-01 MED ORDER — PROPOFOL 10 MG/ML IV BOLUS
INTRAVENOUS | Status: DC | PRN
  Administered 2016-05-01 (×5): 20 mg via INTRAVENOUS
  Administered 2016-05-01: 60 mg via INTRAVENOUS
  Administered 2016-05-01 (×2): 20 mg via INTRAVENOUS

## 2016-05-01 NOTE — Anesthesia Preprocedure Evaluation (Signed)
Anesthesia Evaluation  Patient identified by MRN, date of birth, ID band Patient awake    Reviewed: Allergy & Precautions, NPO status , Patient's Chart, lab work & pertinent test results  History of Anesthesia Complications (+) PONV  Airway Mallampati: II   Neck ROM: full    Dental   Pulmonary    breath sounds clear to auscultation       Cardiovascular hypertension,  Rhythm:regular Rate:Normal     Neuro/Psych  Headaches,    GI/Hepatic   Endo/Other  obese  Renal/GU      Musculoskeletal  (+) Arthritis ,   Abdominal   Peds  Hematology   Anesthesia Other Findings   Reproductive/Obstetrics                             Anesthesia Physical Anesthesia Plan  ASA: II  Anesthesia Plan: MAC   Post-op Pain Management:    Induction: Intravenous  Airway Management Planned: Nasal Cannula  Additional Equipment:   Intra-op Plan:   Post-operative Plan:   Informed Consent: I have reviewed the patients History and Physical, chart, labs and discussed the procedure including the risks, benefits and alternatives for the proposed anesthesia with the patient or authorized representative who has indicated his/her understanding and acceptance.     Plan Discussed with: CRNA, Anesthesiologist and Surgeon  Anesthesia Plan Comments:         Anesthesia Quick Evaluation

## 2016-05-01 NOTE — Op Note (Signed)
[name] Patient Name: Kayla Cooley Procedure Date: 05/01/2016 MRN: [id number] Attending MD: Atilano Ina MD, MD Date of Birth: 1970/04/29 CSN: [id number] Age: 46 Admit Type: Outpatient Procedure:                Upper GI endoscopy Indications:              Heartburn Providers:                Mary Sella. [name] MD, MD, Anthony Sar, RN, Kandice Robinsons, Technician Referring MD:              Medicines:                Propofol per Anesthesia Complications:            No immediate complications. Estimated blood loss:                            Minimal. Estimated Blood Loss:     Estimated blood loss was minimal. Procedure:                Pre-Anesthesia Assessment:                           - Prior to the procedure, a History and Physical                            was performed, and patient medications and                            allergies were reviewed. The patient is competent.                            The risks and benefits of the procedure and the                            sedation options and risks were discussed with the                            patient. All questions were answered and informed                            consent was obtained. Patient identification and                            proposed procedure were verified by the physician,                            the nurse, the anesthetist and the technician in                            the procedure room. Mental Status Examination:                            alert and  oriented. Airway Examination: normal                            oropharyngeal airway and neck mobility. Respiratory                            Examination: clear to auscultation. CV Examination:                            normal. Prophylactic Antibiotics: The patient does                            not require prophylactic antibiotics. Prior                            Anticoagulants: The patient has  taken no previous                            anticoagulant or antiplatelet agents. ASA Grade                            Assessment: II - A patient with mild systemic                            disease. After reviewing the risks and benefits,                            the patient was deemed in satisfactory condition to                            undergo the procedure. The anesthesia plan was to                            use moderate sedation / analgesia (conscious                            sedation). Immediately prior to administration of                            medications, the patient was re-assessed for                            adequacy to receive sedatives. The heart rate,                            respiratory rate, oxygen saturations, blood                            pressure, adequacy of pulmonary ventilation, and                            response to care were monitored throughout the  procedure. The physical status of the patient was                            re-assessed after the procedure.                           After obtaining informed consent, the endoscope was                            passed under direct vision. Throughout the                            procedure, the patient's blood pressure, pulse, and                            oxygen saturations were monitored continuously. The                            Endoscope was introduced through the mouth, and                            advanced to the duodenal bulb. The upper GI                            endoscopy was accomplished with ease. The patient                            tolerated the procedure well. Scope In: Scope Out: Findings:      Normal appearing esophagus. no stricture. z line at about 35cm. z line       has some irregularity with evidence of esophagitis. no evidence of       mucosal break. no ulcer. no stricture. sleeve widely patent and larger       than typical sleeve.  no difficulty in reaching pylorus. no overt       evidence of corkscrew      see text Impression:               - GERD. Biopsied z line x 2                           - GERD. Biopsied. Moderate Sedation:      Moderate (conscious) sedation was personally administered by an       anesthesia professional. The following parameters were monitored: oxygen       saturation, heart rate, blood pressure, respiratory rate, EKG, adequacy       of pulmonary ventilation, and response to care. Total physician       intraservice time was 9 minutes. Recommendation:           - Patient has a contact number available for                            emergencies.                           - Resume previous diet today.                           -  Continue present medications.                           - Patient has a contact number available for                            emergencies. The signs and symptoms of potential                            delayed complications were discussed with the                            patient. Return to normal activities tomorrow.                            Written discharge instructions were provided to the                            patient.                           - The signs and symptoms of potential delayed                            complications were discussed with the patient. Procedure Code(s):        --- Professional ---                           [id number], Esophagogastroduodenoscopy, flexible,                            transoral; diagnostic, including collection of                            specimen(s) by brushing or washing, when performed                            (separate procedure) Diagnosis Code(s):        --- Professional ---                           R12, Heartburn CPT copyright 2016 American Medical Association. All rights reserved. The codes documented in this report are preliminary and upon coder review may  be revised to meet current compliance  requirements. Gaynelle AduEric Leam Madero, MD Atilano InaEric M Garland Smouse MD, MD 05/01/2016 8:26:37 AM This report has been signed electronically. Number of Addenda: 0

## 2016-05-01 NOTE — Transfer of Care (Signed)
Immediate Anesthesia Transfer of Care Note  Patient: Kayla Cooley  Procedure(s) Performed: Procedure(s): ESOPHAGOGASTRODUODENOSCOPY (EGD) WITH PROPOFOL POSSIBLE BIOPSY (N/A)  Patient Location: PACU  Anesthesia Type:MAC  Level of Consciousness:  sedated, patient cooperative and responds to stimulation  Airway & Oxygen Therapy:Patient Spontanous Breathing and Patient connected to face mask oxgen  Post-op Assessment:  Report given to PACU RN and Post -op Vital signs reviewed and stable  Post vital signs:  Reviewed and stable  Last Vitals:  Vitals:   05/01/16 0641  BP: (!) 142/85  Pulse: 70  Resp: 13  Temp: 35.6 C    Complications: No apparent anesthesia complications

## 2016-05-01 NOTE — H&P (Signed)
Kayla Cooley is an 46 y.o. female.   Chief Complaint: here for upper endo HPI: 46 yo female s/p lap sleeve gastrectomy 2014 by dr Biagio Quint who has done quite well but has had ongoing reflux. She requires daily PPI therapy. Still has 2/10 heartburn with meds. If doesn't take PPI -heartburn is severe. No vomiting/hematmemsis. Has had good weight loss. Takes supplements. Uses correct eating techniques. No tobacco.   Past Medical History:  Diagnosis Date  . Allergy   . Arthritis   . Headache(784.0)    migraines but none since hysterectomy  . Hypertension   . PONV (postoperative nausea and vomiting)     Past Surgical History:  Procedure Laterality Date  . ABDOMINAL HYSTERECTOMY    . BREAST REDUCTION SURGERY  1992  . CARPAL TUNNEL RELEASE  2001   right hand  . JOINT REPLACEMENT     left  . LAPAROSCOPIC GASTRIC SLEEVE RESECTION N/A 01/13/2013   Procedure: LAPAROSCOPIC GASTRIC SLEEVE RESECTION;  Surgeon: Lodema Pilot, DO;  Location: [facility];  Service: General;  Laterality: N/A;  . PARTIAL HYSTERECTOMY  2009  . sleeve gastrectomy    . TOTAL KNEE ARTHROPLASTY  04/2009   left knee  . TUBAL LIGATION    . UPPER GI ENDOSCOPY  01/13/2013   Procedure: UPPER GI ENDOSCOPY;  Surgeon: Lodema Pilot, DO;  Location: [facility];  Service: General;;  . uterine ablation      History reviewed. No pertinent family history. Social History:  reports that she has never smoked. She has never used smokeless tobacco. She reports that she drinks alcohol. She reports that she does not use drugs.  Allergies:  Allergies  Allergen Reactions  . Aspirin Hives  . Cashew Nut Oil Hives    Medications Prior to Admission  Medication Sig Dispense Refill  . acetaminophen (TYLENOL) 500 MG tablet Take 1,000 mg by mouth every 6 (six) hours as needed for moderate pain or headache.    . Biotin 5000 MCG CAPS Take 5,000 mcg by mouth daily.    . cetirizine (ZYRTEC) 10 MG tablet Take 10 mg by mouth every morning.     .  pantoprazole (PROTONIX) 40 MG tablet Take 40 mg by mouth 2 (two) times daily.    . Prenatal Vit-Fe Fumarate-FA (PRENATAL MULTIVITAMIN) TABS tablet Take 1 tablet by mouth every morning.     . vitamin B-12 (CYANOCOBALAMIN) 500 MCG tablet Take 500 mcg by mouth daily.    . clobetasol cream (TEMOVATE) 0.05 % Apply 1 application topically daily as needed (For eczema.).    Marland Kitchen Melatonin 5 MG TABS Take 5 mg by mouth at bedtime as needed (sleep).      No results found for this or any previous visit (from the past 48 hour(s)). No results found.  Review of Systems  Constitutional: Negative for weight loss.  HENT: Negative for nosebleeds.   Eyes: Negative for blurred vision.  Respiratory: Negative for shortness of breath.        Got over recent cold a few weeks ago  Cardiovascular: Negative for chest pain, palpitations, orthopnea and PND.       Denies DOE  Gastrointestinal: Positive for heartburn. Negative for blood in stool, diarrhea and vomiting.  Genitourinary: Negative for dysuria and hematuria.  Musculoskeletal: Negative.   Skin: Negative for itching and rash.  Neurological: Negative for dizziness, focal weakness, seizures, loss of consciousness and headaches.       Denies TIAs, amaurosis fugax  Endo/Heme/Allergies: Does not bruise/bleed easily.  Psychiatric/Behavioral: The  patient is not nervous/anxious.     Blood pressure (!) 142/85, pulse 70, temperature 98.3 F (36.8 C), temperature source Oral, resp. rate 13, height 5\' 1"  (1.549 m), weight 75.3 kg (166 lb), SpO2 100 %. Physical Exam  Vitals reviewed. Constitutional: She is oriented to person, place, and time. She appears well-developed and well-nourished. No distress.  HENT:  Head: Normocephalic and atraumatic.  Right Ear: External ear normal.  Left Ear: External ear normal.  Eyes: Conjunctivae are normal. No scleral icterus.  Neck: Normal range of motion. Neck supple. No tracheal deviation present. No thyromegaly present.   Cardiovascular: Normal rate and normal heart sounds.   Respiratory: Effort normal and breath sounds normal. No stridor. No respiratory distress. She has no wheezes.  GI: Soft. She exhibits no distension. There is no tenderness. There is no rebound.  Musculoskeletal: She exhibits no edema or tenderness.  Lymphadenopathy:    She has no cervical adenopathy.  Neurological: She is alert and oriented to person, place, and time. She exhibits normal muscle tone.  Skin: Skin is warm and dry. No rash noted. She is not diaphoretic. No erythema. No pallor.  Psychiatric: She has a normal mood and affect. Her behavior is normal. Judgment and thought content normal.     Assessment/Plan H/o LSG 2014 GERD  For upper endo to evaluate eso and stomach with possible bx Discussed risk/benefits - bleeding/perforation, oversedation, need for addl procedures, gpain  Mary SellaEric M. Andrey CampanileWilson, MD, FACS General, Bariatric, & Minimally Invasive Surgery [facility] [place]   Atilano InaWILSON,Makayia Duplessis M, MD 05/01/2016, 7:35 AM

## 2016-05-01 NOTE — Discharge Instructions (Signed)
Esophagogastroduodenoscopy, Care After °Introduction °Refer to this sheet in the next few weeks. These instructions provide you with information about caring for yourself after your procedure. Your health care provider may also give you more specific instructions. Your treatment has been planned according to current medical practices, but problems sometimes occur. Call your health care provider if you have any problems or questions after your procedure. °What can I expect after the procedure? °After the procedure, it is common to have: °· A sore throat. °· Nausea. °· Bloating. °· Dizziness. °· Fatigue. °Follow these instructions at home: °· Do not eat or drink anything until the numbing medicine (local anesthetic) has worn off and your gag reflex has returned. You will know that the local anesthetic has worn off when you can swallow comfortably. °· Do not drive for 24 hours if you received a medicine to help you relax (sedative). °· If your health care provider took a tissue sample for testing during the procedure, make sure to get your test results. This is your responsibility. Ask your health care provider or the department performing the test when your results will be ready. °· Keep all follow-up visits as told by your health care provider. This is important. °Contact a health care provider if: °· You cannot stop coughing. °· You are not urinating. °· You are urinating less than usual. °Get help right away if: °· You have trouble swallowing. °· You cannot eat or drink. °· You have throat or chest pain that gets worse. °· You are dizzy or light-headed. °· You faint. °· You have nausea or vomiting. °· You have chills. °· You have a fever. °· You have severe abdominal pain. °· You have black, tarry, or bloody stools. °This information is not intended to replace advice given to you by your health care provider. Make sure you discuss any questions you have with your health care provider. °Document Released: 02/28/2012  Document Revised: 08/19/2015 Document Reviewed: 02/04/2015 °© 2017 Elsevier ° °

## 2016-05-01 NOTE — Anesthesia Postprocedure Evaluation (Signed)
Anesthesia Post Note  Patient: Kayla Cooley  Procedure(s) Performed: Procedure(s) (LRB): ESOPHAGOGASTRODUODENOSCOPY (EGD) WITH PROPOFOL POSSIBLE BIOPSY (N/A)  Patient location during evaluation: PACU Anesthesia Type: MAC Level of consciousness: awake and alert Pain management: pain level controlled Vital Signs Assessment: post-procedure vital signs reviewed and stable Respiratory status: spontaneous breathing, nonlabored ventilation, respiratory function stable and patient connected to nasal cannula oxygen Cardiovascular status: stable and blood pressure returned to baseline Anesthetic complications: no       Last Vitals:  Vitals:   05/01/16 0641 05/01/16 0805  BP: (!) 142/85 107/70  Pulse: 70 68  Resp: 13 14  Temp: 36.8 C 36.7 C    Last Pain:  Vitals:   05/01/16 0805  TempSrc: Oral                 Yalena Colon S

## 2016-05-02 ENCOUNTER — Encounter: Payer: Self-pay | Admitting: Physician Assistant

## 2016-05-02 ENCOUNTER — Encounter (HOSPITAL_COMMUNITY): Payer: Self-pay | Admitting: General Surgery

## 2016-05-16 ENCOUNTER — Encounter: Payer: Self-pay | Admitting: Nurse Practitioner

## 2016-05-16 ENCOUNTER — Ambulatory Visit (INDEPENDENT_AMBULATORY_CARE_PROVIDER_SITE_OTHER): Payer: Managed Care, Other (non HMO) | Admitting: Nurse Practitioner

## 2016-05-16 VITALS — BP 130/82 | HR 76 | Ht 61.0 in | Wt 168.4 lb

## 2016-05-16 DIAGNOSIS — R1013 Epigastric pain: Secondary | ICD-10-CM

## 2016-05-16 DIAGNOSIS — K219 Gastro-esophageal reflux disease without esophagitis: Secondary | ICD-10-CM

## 2016-05-16 MED ORDER — DEXLANSOPRAZOLE 60 MG PO CPDR: 60.0000 mg | DELAYED_RELEASE_CAPSULE | Freq: Every day | ORAL | 3 refills | Status: DC

## 2016-05-16 MED ORDER — RANITIDINE HCL 150 MG PO TABS: 150.0000 mg | ORAL_TABLET | Freq: Every day | ORAL | 3 refills | Status: DC

## 2016-05-16 NOTE — Progress Notes (Signed)
HPI: Patient is 46 year old female [name]. Patient is status post sleeve gastrectomy in 2014. Approximately one year ago she developed problems with heartburn,  regurgitation and epigastric discomfort. She was started on a daily PPI, at some point it was increased to BID. The heartburn and regurgitation improved though she occasionally still gets both. Her main complaint is epigastric burning / gnawing relieved with food. The discomfort does not radiate, it can start as early as an hour after eating. She takes no NSAIDs. She has no associated nausea.BMs normal. No black stools or rectal bleeding. No weight loss.    Past Medical History:  Diagnosis Date  . Allergy   . Arthritis   . Headache(784.0)    migraines but none since hysterectomy  . Hypertension   . PONV (postoperative nausea and vomiting)      Past Surgical History:  Procedure Laterality Date  . ARTHROSCOPIC REPAIR ACL Right   . BREAST REDUCTION SURGERY  1992  . CARPAL TUNNEL RELEASE  2001   right hand  . ESOPHAGOGASTRODUODENOSCOPY (EGD) WITH PROPOFOL N/A 05/01/2016   Procedure: ESOPHAGOGASTRODUODENOSCOPY (EGD) WITH PROPOFOL POSSIBLE BIOPSY;  Surgeon: Gaynelle AduEric [name], MD;  [facility];  Service: General;  Laterality: N/A;  . KNEE ARTHROSCOPY Left    x 2  . KNEE ARTHROSCOPY W/ ACL RECONSTRUCTION Left   . LAPAROSCOPIC GASTRIC SLEEVE RESECTION N/A 01/13/2013   Procedure: LAPAROSCOPIC GASTRIC SLEEVE RESECTION;  Surgeon: Lodema PilotBrian Layton, DO;  Location: [facility];  Service: General;  Laterality: N/A;  . PARTIAL HYSTERECTOMY  2009  . REPLACEMENT TOTAL KNEE Left   . skin removal surgery    . TUBAL LIGATION    . UPPER GI ENDOSCOPY  01/13/2013   Procedure: UPPER GI ENDOSCOPY;  Surgeon: Lodema PilotBrian Layton, DO;  Location: [facility];  Service: General;;  . uterine ablation     Family History  Problem Relation Age of Onset  . Heart disease Father   . Diabetes  Maternal Grandmother   . Prostate cancer Maternal Grandfather   . Breast cancer Paternal Aunt   . Colon cancer Neg Hx    Social History  Substance Use Topics  . Smoking status: Never Smoker  . Smokeless tobacco: Never Used  . Alcohol use Yes     Comment: socially   Current Outpatient Prescriptions  Medication Sig Dispense Refill  . acetaminophen (TYLENOL) 500 MG tablet Take 1,000 mg by mouth every 6 (six) hours as needed for moderate pain or headache.    . Biotin (PA BIOTIN) 1000 MCG tablet Take 1,000 mcg by mouth 3 (three) times daily.    . cetirizine (ZYRTEC) 10 MG tablet Take 10 mg by mouth every morning.     . clobetasol cream (TEMOVATE) 0.05 % Apply 1 application topically daily as needed (For eczema.).    Marland Kitchen. Melatonin 5 MG TABS Take 5 mg by mouth at bedtime as needed (sleep).    . pantoprazole (PROTONIX) 40 MG tablet Take 40 mg by mouth 2 (two) times daily.    . Prenatal Vit-Fe Fumarate-FA (PRENATAL MULTIVITAMIN) TABS tablet Take 1 tablet by mouth every morning.     . vitamin B-12 (CYANOCOBALAMIN) 500 MCG tablet Take 500 mcg by mouth daily.     No current facility-administered medications for this visit.    Allergies  Allergen Reactions  . Aspirin Hives  . Cashew Nut Oil Hives     Review of Systems: Positive for vision  changes, hoarse voice . All other systems reviewed and negative except where noted in HPI.    Physical Exam: BP 130/82   Pulse 76   Ht 5\' 1"  (1.549 m)   Wt 168 lb 6.4 oz (76.4 kg)   BMI 31.82 kg/m  Constitutional:  Well-developed, white female in no acute distress. Psychiatric: Normal mood and affect. Behavior is normal. HEENT: Normocephalic and atraumatic. Conjunctivae are normal. No scleral icterus. Neck supple.  Cardiovascular: Normal rate, regular rhythm.  Pulmonary/chest: Effort normal and breath sounds normal. No wheezing, rales or rhonchi. Abdominal: Soft, nondistended, nontender. Bowel sounds active throughout. There are no masses palpable.   Extremities: no edema Lymphadenopathy: No cervical adenopathy noted. Neurological: Alert and oriented to person place and time. Skin: Skin is warm and dry. No rashes noted.   ASSESSMENT AND PLAN:  35. 46 year old female, s/p sleeve gastrectomy in 2014, her with epigastric / mid upper abdominal burning and gnawing discomfort which is consistently relieved by eating. Epigastric discomfort not relieved with BID PPI. She had an upper endoscopy by Dr. [name] on 05/04/16. Extent of exam was to the duodenal bulb andd findings included esophagitis and a gastric sleeve which was described a being larger than typical. No other findings.  --etiology of epigastric burning / gnawing not yet determined. Will try changing GERD regimen from Protonix bid to Dexilant in am and Zantac in pm. Patient to call me with a condition update in 4-6 weeks. If no improvent with arrange for EGD for visualization of duodenum to evaluate for duodenal ulcers.   2. GERD manifested as pyrosis and regurgitation, both of which have overall improved on BID PPI>  Recent upper endoscopy with biopsies by surgery revealed esophagitis without Barrett's. Esophageal biopsies c/w chronic esophagitis. On chronic PPI which was helped reflux symptoms but not the epigastric gnawing.  -Antireflux measures discussed, GERD literature given -Because of refractory upper abdominal gnawing I am going to change her from BID PPI to Dexilant in am and Zantac in evening.    Willette Cluster, NP  05/16/2016, 2:24 PM  Cc: Gaynelle Adu, MD

## 2016-05-16 NOTE — Patient Instructions (Signed)
If you are age 47 or older, your body mass index should be between 23-30. Your Body mass index is 31.82 kg/m. If this is out of the aforementioned range listed, please consider follow up with your Primary Care Provider.  If you are age 47 or younger, your body mass index should be between 19-25. Your Body mass index is 31.82 kg/m. If this is out of the aformentioned range listed, please consider follow up with your Primary Care Provider.   Please call in 6-8 weeks with a follow up. [phone] ask for Beth   STOP taking the Protonix   Use Dexilant every morning.  Zantac 150 mg 150 mg at bedtime.  Thank you for choosing [facility]

## 2016-05-22 NOTE — Progress Notes (Signed)
Reviewed and agree with documentation and assessment and plan. K. Veena Anhthu Perdew , MD   

## 2016-06-15 ENCOUNTER — Telehealth: Payer: Self-pay | Admitting: Nurse Practitioner

## 2016-06-15 NOTE — Telephone Encounter (Signed)
-----   Message from Napoleon FormKavitha Nandigam V, MD sent at 06/14/2016 11:09 AM EDT ----- Titus DubinGlad she is doing better, understand her concern about long term PPI use. We will try to schedule her for manometry and pH study on PPI soon.   Thanks VN ----- Message ----- From: Gaynelle AduEric [name], MD Sent: 06/14/2016  10:40 AM To: Napoleon FormKavitha Nandigam V, MD  Hi,  Thank you for seeing this lady. She is a sleeve gastrectomy from 2014 done by one of our former surgeons who I inherited.  She contacted me via our health portal and told me that the new meds you all gave her are working on some of her symptoms but feels like 'reflux' is worse off of protonix.  She is concerned about long term effects of PPIs as well as just having to take meds for this problem.   I advised her to let you know how things are going.  Do you think any role for manometry or pH probe??  She is asking about conversion to bypass. I just to want to make sure i'm not missing anything before having to do revisional surgery on her.   Thanks Ramond MarrowEric  Eric M. Andrey CampanileWilson, MD, FACS General, Bariatric, & Minimally Invasive Surgery [facility], [place] (c) [phone]

## 2016-06-15 NOTE — Telephone Encounter (Signed)
She confirms the symptoms of worsened reflux. I will get her scheduled for esophageal manometry and ph study on Dexilant. Do you want her to follow up with you in a few weeks to review the results?

## 2016-06-15 NOTE — Telephone Encounter (Signed)
She confirms the symptoms of worsened reflux. I will get her scheduled for esophageal manometry and ph study on Dexilant. Do you want her to follow up with you in a few weeks to review the results? 

## 2016-06-16 ENCOUNTER — Other Ambulatory Visit: Payer: Self-pay

## 2016-06-16 DIAGNOSIS — K219 Gastro-esophageal reflux disease without esophagitis: Secondary | ICD-10-CM

## 2016-06-16 DIAGNOSIS — Z9889 Other specified postprocedural states: Secondary | ICD-10-CM

## 2016-06-16 NOTE — Telephone Encounter (Signed)
Esophageal mano and ph study on 06/26/16 at 10:30. The patient is instructed to remain on Dexilant and to arrive fasting. Information mailed and sent via My Chart. Appointment set for follow up if needed.

## 2016-06-16 NOTE — Telephone Encounter (Signed)
Ok. Thanks!

## 2016-06-22 ENCOUNTER — Telehealth: Payer: Self-pay | Admitting: Gastroenterology

## 2016-06-22 NOTE — Telephone Encounter (Signed)
Esophageal mano at 10:30 am. She is being studied on Dexilant. She will take her Dexilant by 6:30 am the morning of her test.

## 2016-06-26 ENCOUNTER — Encounter (HOSPITAL_COMMUNITY)
Admission: RE | Disposition: A | Discharge status: Home / Self Care | Payer: Self-pay | Source: Ambulatory Visit | Attending: Gastroenterology

## 2016-06-26 ENCOUNTER — Ambulatory Visit (HOSPITAL_COMMUNITY)
Admission: RE | Admit: 2016-06-26 | Discharge: 2016-06-26 | Disposition: A | Discharge status: Home / Self Care | Payer: Managed Care, Other (non HMO) | Source: Ambulatory Visit | Attending: Gastroenterology | Admitting: Gastroenterology

## 2016-06-26 DIAGNOSIS — K219 Gastro-esophageal reflux disease without esophagitis: Secondary | ICD-10-CM | POA: Insufficient documentation

## 2016-06-26 HISTORY — PX: 24 HOUR PH STUDY: SHX5419

## 2016-06-26 HISTORY — PX: ESOPHAGEAL MANOMETRY: SHX5429

## 2016-06-26 HISTORY — DX: Gastro-esophageal reflux disease without esophagitis: K21.9

## 2016-06-26 SURGERY — MANOMETRY, ESOPHAGUS
Anesthesia: LOCAL

## 2016-06-26 MED ORDER — LIDOCAINE VISCOUS 2 % MT SOLN
OROMUCOSAL | Status: AC
  Filled 2016-06-26: qty 15

## 2016-06-26 SURGICAL SUPPLY — 2 items
FACESHIELD LNG OPTICON STERILE (SAFETY) IMPLANT
GLOVE BIO SURGEON STRL SZ8 (GLOVE) ×4 IMPLANT

## 2016-06-26 NOTE — Progress Notes (Signed)
Esophageal Manometry done per protocol . Pt tolerated well without distress.  PH probe with impedance probe inserted per protocol placed at 32cm. Pt tolerated well. No complications noted. Went over [facility] monitor and study instructions with patient using teach back. Pt voiced understanding. Pt will come back at or after 1200 tomorrow 06/27/2016 to have probe removed and monitor downloaded.

## 2016-06-28 ENCOUNTER — Encounter (HOSPITAL_COMMUNITY): Payer: Self-pay | Admitting: Gastroenterology

## 2016-07-05 ENCOUNTER — Encounter (HOSPITAL_COMMUNITY): Payer: Self-pay | Admitting: Gastroenterology

## 2016-07-05 DIAGNOSIS — K219 Gastro-esophageal reflux disease without esophagitis: Secondary | ICD-10-CM

## 2016-07-20 ENCOUNTER — Ambulatory Visit: Payer: Managed Care, Other (non HMO) | Admitting: Gastroenterology

## 2016-08-28 ENCOUNTER — Other Ambulatory Visit (HOSPITAL_COMMUNITY): Payer: Self-pay | Admitting: General Surgery

## 2016-09-11 ENCOUNTER — Encounter: Payer: Managed Care, Other (non HMO) | Attending: General Surgery | Admitting: Skilled Nursing Facility1

## 2016-09-11 ENCOUNTER — Encounter: Payer: Self-pay | Admitting: Skilled Nursing Facility1

## 2016-09-11 DIAGNOSIS — Z713 Dietary counseling and surveillance: Secondary | ICD-10-CM | POA: Diagnosis present

## 2016-09-11 DIAGNOSIS — Z6831 Body mass index (BMI) 31.0-31.9, adult: Secondary | ICD-10-CM | POA: Insufficient documentation

## 2016-09-11 DIAGNOSIS — K219 Gastro-esophageal reflux disease without esophagitis: Secondary | ICD-10-CM

## 2016-09-11 DIAGNOSIS — E663 Overweight: Secondary | ICD-10-CM | POA: Diagnosis not present

## 2016-09-11 NOTE — Patient Instructions (Addendum)
Celebrate MultiComplete with 18 mg Iron (this provides 6000 IU of Vitamin D3)  Get back to taking calcium: Tums are fine  GERD producers: caffeine (coffee/tea), alcohol, peppermint, chocolate, tomatoes, onions, monster drinks    Try alkaline water pH 8.8

## 2016-09-11 NOTE — Progress Notes (Signed)
  Primary concerns today: Post-operative Bariatric Surgery Nutrition Management.  Pt had the sleeve 2014. Pt states she is getting a RYGB to cure her GERD. Pt states if her stomach is empty her GERD is worse but food on her stomach soothes it. Pt states every night she eats popcorn to feel content. Pt states due to an infected ingrown hair she has not been as active. Pt has had body contouring. clcik and body tech. 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  Pre-Op Assessment Visit:  Pre-Operative RYGB from a Sleeve Surgery Patient was seen on 09/11/2016 for Pre-Operative Nutrition Assessment. Assessment [facility] on 09/12/2016.    Start weight at [facility]: 168.3.2 BMI: 31.78 (pt was educated on this)  24-hr recall: eating 6 times a day B (AM): protein shake with 2 shots of esspresso  Snk (AM): atkins bar L (PM): low card wrap with ham and cheese Snk (PM): beef jerky D (PM): salad with canned chicken  Snk (PM): popcorn  Encouraged to engage in 150 minutes of moderate physical activity including cardiovascular and weight baring weekly  Handouts given during visit include:  . Pre-Op Goals . Bariatric Surgery Protein Shakes During the appointment today the following Pre-Op Goals were reviewed with the patient: . Maintain or lose weight as instructed by your surgeon . Make healthy food choices . Begin to limit portion sizes . Limited concentrated sugars and fried foods . Keep fat/sugar in the single digits per serving on             food labels . Practice CHEWING your food  (aim for 30 chews per bite or until applesauce consistency) . Practice not drinking 15 minutes before, during, and 30 minutes after each meal/snack . Avoid all carbonated beverages  . Avoid/limit caffeinated beverages  . Avoid all sugar-sweetened beverages . Consume 3 meals per day; eat  every 3-5 hours . Make a list of non-food related activities . Aim for 64-100 ounces of FLUID daily  . Aim for at least 60-80 grams of PROTEIN daily . Look for a liquid protein source that contain ?15 g protein and ?5 g carbohydrate  (ex: shakes, drinks, shots)  -Follow diet recommendations listed below   Energy and Macronutrient Recomendations: Calories: 1500 Carbohydrate: 170 Protein: 112 Fat: 42  Demonstrated degree of understanding via:  Teach Back  Teaching Method Utilized:  Visual Auditory Hands on  Barriers to learning/adherence to lifestyle change: none identified   Patient to call the Nutrition and Diabetes Education Services to enroll in Pre-Op and Post-Op Nutrition Education when surgery date is scheduled.

## 2016-09-20 ENCOUNTER — Ambulatory Visit (HOSPITAL_COMMUNITY)
Admission: RE | Admit: 2016-09-20 | Discharge: 2016-09-20 | Disposition: A | Discharge status: Home / Self Care | Payer: Managed Care, Other (non HMO) | Source: Ambulatory Visit | Attending: General Surgery | Admitting: General Surgery

## 2016-09-20 ENCOUNTER — Other Ambulatory Visit: Payer: Self-pay

## 2016-09-20 ENCOUNTER — Other Ambulatory Visit (HOSPITAL_COMMUNITY): Payer: Self-pay | Admitting: General Surgery

## 2016-09-20 DIAGNOSIS — K219 Gastro-esophageal reflux disease without esophagitis: Secondary | ICD-10-CM | POA: Insufficient documentation

## 2016-09-20 DIAGNOSIS — Z01818 Encounter for other preprocedural examination: Secondary | ICD-10-CM | POA: Diagnosis present

## 2016-09-20 DIAGNOSIS — K449 Diaphragmatic hernia without obstruction or gangrene: Secondary | ICD-10-CM | POA: Insufficient documentation

## 2016-10-10 ENCOUNTER — Encounter: Payer: Managed Care, Other (non HMO) | Attending: General Surgery | Admitting: Registered"

## 2016-10-10 ENCOUNTER — Encounter: Payer: Self-pay | Admitting: Registered"

## 2016-10-10 DIAGNOSIS — E669 Obesity, unspecified: Secondary | ICD-10-CM

## 2016-10-10 DIAGNOSIS — E663 Overweight: Secondary | ICD-10-CM | POA: Diagnosis not present

## 2016-10-10 DIAGNOSIS — Z713 Dietary counseling and surveillance: Secondary | ICD-10-CM | POA: Diagnosis present

## 2016-10-10 DIAGNOSIS — Z6831 Body mass index (BMI) 31.0-31.9, adult: Secondary | ICD-10-CM | POA: Insufficient documentation

## 2016-10-10 NOTE — Patient Instructions (Addendum)
-   Continue to reduce caffeine intake to 2 cups of coffee a day.  - Continue to aim for at least 64 oz fluid daily.

## 2016-10-10 NOTE — Progress Notes (Signed)
Appt start time: 4:05 end time: 4:30  Assessment: 1st SWL Appointment.   Start Wt at [facility]: 168.3 Wt: 166.5 BMI: 31.46   Pt arrives having lost 1.8 lbs from previous visit. Pt states recent upper GI revealed hiatal hernia. Pt states she has tried alkaline water which is helping reflux at times. Pt states she is taking TUMS and plans to continue taking prenatal vitamin before switching to Celebrate chewables. Pt states she is having revision surgery to improve reflux. Pt states she has reduced coffee down to 3 per day and will continue to decrease as surgery date approaches. Pt states she "decided to stop force feeding things that cause reflux".  Pt states she needs 90 days of SWL visits prior to revision surgery. Pt wants to be no less than 160 lbs prior to surgery; does not want to be less than 150 lbs ever.    MEDICATIONS: See list   DIETARY INTAKE:  24-hr recall:  B ( AM): 1/2 c scrambled eggs w/ cheese or Click protein shake or premier protein shake Snk ( AM): 10 saltines, 50 cal string cheese  L ( PM): Joseph's low carb pita, mozzarella cheese, marinara Snk ( PM): saltines D ( PM): salad w/ can of chicken or chicken patty Snk ( PM): popcorn Beverages: coffee, beer (Bud Select-55) on weekends, alkaline water  Usual physical activity: walking, elliptical   Diet to Follow: 1500 calories 170 g carbohydrates 112 g protein 42 g fat  Preferred Learning Style:   No preference indicated   Learning Readiness:   Ready  Change in progress     Nutritional Diagnosis:  [place]-3.3 Overweight/obesity related to past poor dietary habits and physical inactivity as evidenced by patient w/ planned RYGB surgery following dietary guidelines for continued weight loss.    Intervention:  Nutrition counseling for upcoming Bariatric Surgery.  Goals:  - Continue to reduce caffeine intake to 2 cups of coffee a day. - Continue to aim for at least 64 oz fluid daily  Teaching Method Utilized:   Visual Auditory  Handouts given during visit include:  none  Barriers to learning/adherence to lifestyle change: none  Demonstrated degree of understanding via:  Teach Back   Monitoring/Evaluation:  Dietary intake, exercise, and body weight in 1 month(s).

## 2016-10-24 ENCOUNTER — Ambulatory Visit (INDEPENDENT_AMBULATORY_CARE_PROVIDER_SITE_OTHER): Payer: Managed Care, Other (non HMO) | Admitting: Psychiatry

## 2016-10-24 DIAGNOSIS — F419 Anxiety disorder, unspecified: Secondary | ICD-10-CM | POA: Diagnosis not present

## 2016-11-07 ENCOUNTER — Encounter: Payer: Managed Care, Other (non HMO) | Attending: General Surgery | Admitting: Skilled Nursing Facility1

## 2016-11-07 ENCOUNTER — Encounter: Payer: Self-pay | Admitting: Skilled Nursing Facility1

## 2016-11-07 DIAGNOSIS — Z6831 Body mass index (BMI) 31.0-31.9, adult: Secondary | ICD-10-CM | POA: Diagnosis not present

## 2016-11-07 DIAGNOSIS — Z713 Dietary counseling and surveillance: Secondary | ICD-10-CM | POA: Diagnosis not present

## 2016-11-07 DIAGNOSIS — E663 Overweight: Secondary | ICD-10-CM | POA: Diagnosis not present

## 2016-11-07 DIAGNOSIS — E669 Obesity, unspecified: Secondary | ICD-10-CM

## 2016-11-07 NOTE — Progress Notes (Signed)
Appt start time: 4:05 end time: 4:30   RYGB from a Sleeve  Assessment: 2nd SWL Appointment.   Start Wt at [facility]: 168.3 Wt: 168 BMI: 31.74   Pt arrives having gained about 1.8 lbs from previous visit. Pt states recent upper GI revealed hiatal hernia.  Pt states she needs 90 days of SWL visits prior to revision surgery. Pt wants to be no less than 160 lbs prior to surgery; does not want to be less than 150 lbs ever.  Pt states as soon as her stomach is empty she needs to sip water or eat crackers to calm her stomach. Pt states she has cut out caffeine and with no headaches. Pt states she she drinks about 80 ounces of water. Pt states she broke her big toe. Pt states she is very excited to have her GERD cured.   MEDICATIONS: See list   DIETARY INTAKE:  24-hr recall:  B ( AM): 1/2 c scrambled eggs w/ cheese or Click protein shake or premier protein shake Snk ( AM): 10 saltines, 50 cal string cheese  L ( PM): Joseph's low carb pita, mozzarella cheese, marinara Snk ( PM): saltines D ( PM): salad w/ can of chicken or chicken patty Snk ( PM): popcorn Beverages: decaff coffee, beer (Bud Select-55) on weekends, alkaline water  Usual physical activity: walking, elliptical: 30 minutes 6 days a week  Diet to Follow: 1500 calories 170 g carbohydrates 112 g protein 42 g fat  Preferred Learning Style:   No preference indicated   Learning Readiness:   Ready  Change in progress    Nutritional Diagnosis:  [place]-3.3 Overweight/obesity related to past poor dietary habits and physical inactivity as evidenced by patient w/ planned RYGB surgery following dietary guidelines for continued weight loss.    Intervention:  Nutrition counseling for upcoming Bariatric Surgery.  Goals:  -Aim for 150 minutes of physical activity including cardio and weight bearing every week  Teaching Method Utilized:  Visual Auditory  Handouts given during visit include:  none  Barriers to learning/adherence  to lifestyle change: none  Demonstrated degree of understanding via:  Teach Back   Monitoring/Evaluation:  Dietary intake, exercise, and body weight in 1 month(s).

## 2016-11-29 ENCOUNTER — Ambulatory Visit (INDEPENDENT_AMBULATORY_CARE_PROVIDER_SITE_OTHER): Payer: 59 | Admitting: Psychiatry

## 2016-11-29 DIAGNOSIS — F419 Anxiety disorder, unspecified: Secondary | ICD-10-CM

## 2016-12-04 ENCOUNTER — Ambulatory Visit: Payer: Self-pay

## 2016-12-05 ENCOUNTER — Ambulatory Visit: Payer: Self-pay | Admitting: Registered"

## 2016-12-11 ENCOUNTER — Encounter: Payer: Self-pay | Admitting: Registered"

## 2016-12-11 ENCOUNTER — Encounter: Payer: Managed Care, Other (non HMO) | Admitting: Skilled Nursing Facility1

## 2016-12-11 ENCOUNTER — Encounter: Payer: Managed Care, Other (non HMO) | Attending: General Surgery | Admitting: Registered"

## 2016-12-11 DIAGNOSIS — E669 Obesity, unspecified: Secondary | ICD-10-CM

## 2016-12-11 DIAGNOSIS — Z713 Dietary counseling and surveillance: Secondary | ICD-10-CM | POA: Diagnosis not present

## 2016-12-11 DIAGNOSIS — Z6831 Body mass index (BMI) 31.0-31.9, adult: Secondary | ICD-10-CM | POA: Diagnosis not present

## 2016-12-11 DIAGNOSIS — E663 Overweight: Secondary | ICD-10-CM | POA: Diagnosis not present

## 2016-12-11 NOTE — Patient Instructions (Addendum)
-   Aim to chew at least 30 times per bite.   - Aim to reduce alcohol intake.   - Keep up the good work with what you're already doing.   Ship broker App.

## 2016-12-11 NOTE — Progress Notes (Signed)
Appt start time: 4:25 end time: 4:40   RYGB from a Sleeve  Assessment: 3rd SWL Appointment.   Start Wt at [facility]: 168.3 Wt: 173.9 BMI: 32.86   Pt arrives having gained about 5.9 lbs from previous visit. Pt states she is not focused on losing weight, but would like to be around 150 lbs. Pt states she is drinking more than 64 oz fluid a day. Pt states she cannot go to sleep with fluid in her because she feels like it is coming up. Pt states this her last visit prior to surgery. Pt states she does have to be a certain BMI prior to having revisional surgery, only 3 months of SWL visits. Pt states she is ready to have surgery to cure her GERD. Pt is scheduled for pre-op class today.   Pt states recent upper GI revealed hiatal hernia.  Pt states she needs 90 days of SWL visits prior to revision surgery. Pt wants to be no less than 160 lbs prior to surgery; does not want to be less than 150 lbs ever.   Pt states as soon as her stomach is empty she needs to sip water or eat crackers to calm her stomach. Pt states she has cut out caffeine and with no headaches. Pt states she she drinks about 80 ounces of water. Pt states she broke her big toe. Pt states she is very excited to have her GERD cured.    MEDICATIONS: See list   DIETARY INTAKE:  24-hr recall:  B ( AM): 1/2 c scrambled eggs w/ cheese or Click protein shake or premier protein shake Snk ( AM): 10 saltines, 50 cal string cheese  L ( PM): Joseph's low carb pita, mozzarella cheese, marinara Snk ( PM): saltines D ( PM): salad w/ can of chicken or chicken patty Snk ( PM): popcorn Beverages: decaff coffee, beer (Bud Select-55) on weekends,water  Usual physical activity: walking, elliptical: 30 minutes 5-6 days a week and weights 3 days a week  Diet to Follow: 1500 calories 170 g carbohydrates 112 g protein 42 g fat  Preferred Learning Style:   No preference indicated   Learning Readiness:   Ready  Change in  progress    Nutritional Diagnosis:  [place]-3.3 Overweight/obesity related to past poor dietary habits and physical inactivity as evidenced by patient w/ planned RYGB surgery following dietary guidelines for continued weight loss.    Intervention:  Nutrition counseling for upcoming Bariatric Surgery.  Goals:  - Aim to chew at least 30 times per bite.  - Aim to reduce alcohol intake.  - Keep up the good work with what you're already doing.  Ship broker App.   Teaching Method Utilized:  Visual Auditory  Handouts given during visit include:  none  Barriers to learning/adherence to lifestyle change: none  Demonstrated degree of understanding via:  Teach Back   Monitoring/Evaluation:  Dietary intake, exercise, and body weight prn.

## 2016-12-12 ENCOUNTER — Encounter: Payer: Self-pay | Admitting: Skilled Nursing Facility1

## 2016-12-12 NOTE — Progress Notes (Signed)
Pre-Operative Nutrition Class:  Appt start time: 1552   End time:  1830.  Patient was seen on 12/11/2016 for Pre-Operative Bariatric Surgery Education at [facility].   Surgery date: Surgery type: RYGB Start weight at [facility]: 168 Weight today: 173  Samples given per MNT protocol. Patient educated on appropriate usage: Bariatric Advantage Multivitamin Lot # [id number] Exp: 06/19  Bariatric Advantage Calcium  Lot # [id number] Exp: dec-19-2018  Renee Pain Protein  Shake Lot # [id number] Exp: may-27-19 The following the learning objectives were met by the patient during this course:  Identify Pre-Op Dietary Goals and will begin 2 weeks pre-operatively  Identify appropriate sources of fluids and proteins   State protein recommendations and appropriate sources pre and post-operatively  Identify Post-Operative Dietary Goals and will follow for 2 weeks post-operatively  Identify appropriate multivitamin and calcium sources  Describe the need for physical activity post-operatively and will follow MD recommendations  State when to call healthcare provider regarding medication questions or post-operative complications  Handouts given during class include:  Pre-Op Bariatric Surgery Diet Handout  Protein Shake Handout  Post-Op Bariatric Surgery Nutrition Handout  BELT Program Information Flyer  Support Group Information Flyer  [facility] Bariatric Supplements Price List  Follow-Up Plan: [facility] for diet advancement per MD.

## 2017-02-27 NOTE — Progress Notes (Signed)
Preop opn 12/7.  Need orders in epic.

## 2017-02-28 ENCOUNTER — Ambulatory Visit: Payer: Self-pay | Admitting: General Surgery

## 2017-02-28 NOTE — H&P (Signed)
Saniya Tranchina 02/28/2017 9:44 AM Location: [facility] Patient #: [id number] DOB: October 07, 1970 Married / Language: [place] / Race: White Female  History of Present Illness [name] Mykal Batiz MD; 02/28/2017 10:56 AM) The patient is a 46 year old female presenting status-post bariatric surgery. She comes in today for preoperative appointment. I last saw her in May. She has been approved for revisional surgery for conversion from a sleeve gastrectomy to and Roux-en-Y gastric bypass for persistent reflux. She states her reflux is still daily. It is worse with solids but less with liquids. It is not improved. She is still taking Protonix as well as toms multiple times a day. She denies any issues with food or liquids getting stuck. She denies any burping or belching. She denies any medical changes since I last saw her in May. She did have a follow-up upper GI in January which raised the possibility of a questionable small sliding hernia as well as demonstrating inducible reflux. She denies any tobacco use or NSAID use. She denies any fever, chills, vomiting or abdominal pain. She denies any chest pain, chest pressure, shortness of breath, dyspnea on exertion.  Review of systems a conference a 12 point review systems was performed and all systems are negative except for what is mentioned in HPI   08/04/2016 She comes in for long-term follow-up after undergoing laparoscopic sleeve gastrectomy by Dr. Biagio Quint on January 13, 2013. Her preoperative weight was 204 pounds. I last saw her in January. She continues to struggle with heartburn. Liquids exacerbate her heartburn. Dexilant too expensive so back on protonix. she having symptoms despite PPI bid. She underwent esophageal motility in April which showed a normal resting GE junction pressure. Esophageal contractions were 100% peristaltic with normal distal latency and distal contractile integral. No evidence of a hiatal hernia. Normal  basal and residual upper esophageal pressures. Normal relaxation of the GE junction. No significant esophageal peristaltic abnormality detected on this study. She had a pH impedance study done as well which showed good acid suppression, slightly elevated weekly acid reflux events; positive symptom correlation with weekly acid reflux events based on symptom association probability. She is having difficulty drinking water because it makes it feel more acidy. Solids make her symptoms feel better. She will have sensation of acid refluxing back into her upper throat but does not regurgitate food. She also complains of a hoarse voice.  She had an upper GI in November 2016 when she started having some initial reflux symptoms. Esophageal peristalsis was normal. No hiatal hernia was seen. The gastroesophageal junction was noted to be patulous resulting in spontaneous significant reflux. On migrainous review the upper GI her gastric sleeve was also quite symmetrically enlarged. Upper endoscopy in February showed reflux disease and biopsy showed chronic inflammation. There is no narrowing or stricture in her sleeve gastrectomy however her sleeve was quite generous       Problem List/Past Medical [name] 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 [name], MD; 02/28/2017 11:08 AM) Hysterectomy (not due to cancer) - Partial Knee Surgery Bilateral. Mammoplasty; Reduction Bilateral. Oral Surgery  Diagnostic Studies History [name] [name], MD; 02/28/2017 11:08 AM) Colonoscopy 1-5 years ago Pap Smear 1-5 years ago Mammogram within last year  Allergies ([name], RMA; 02/28/2017  9:45 AM) Aspirin *ANALGESICS - NonNarcotic* Cashew  Nut Oil *CHEMICALS* Allergies Reconciled  Medication History Minerva Areola(Jon Lall M. Andrey CampanileWilson, MD; 02/28/2017 11:08 AM) Pantoprazole Sodium (40MG  Tablet DR, 1 (one) Oral two times daily, Taken starting 01/12/2017) Active. Biotin (1MG  Capsule, Oral) Active. Calcium (500MG  Capsule, Oral) Active. Multiple Vitamin (Oral) Active. Vitamin B12-Folic Acid (500-400MCG Tablet, Oral) Active. Medications Reconciled OxyCODONE HCl (5MG /5ML Solution, 5-10 Milliliter Oral every four hours, as needed, Taken starting 02/28/2017) Active. Zofran (4MG  Tablet, 1 (one) Tablet Oral every eight hours, as needed, Taken starting 02/28/2017) Active.  Social History Minerva Areola(Dunya Meiners M. Andrey CampanileWilson, MD; 02/28/2017 11:08 AM) Alcohol use Moderate alcohol use. Tobacco use Never smoker. Caffeine use Coffee. No drug use  Family History Minerva Areola(Makoto Sellitto M. Andrey CampanileWilson, MD; 02/28/2017 11:08 AM) Hypertension Father, Mother. Thyroid problems Daughter, Mother. Breast Cancer Family Members In General. Heart Disease Father.  Pregnancy / Birth History Minerva Areola(Takeisha Cianci M. Andrey CampanileWilson, MD; 02/28/2017 11:08 AM) Para 1 Maternal age 47-20 Irregular periods Age at menarche 10 years. Gravida 1  Other Problems Minerva Areola(Saulo Anthis M. Andrey CampanileWilson, MD; 02/28/2017 11:08 AM) Arthritis    Vitals (Tanisha A. Brown RMA; 02/28/2017 9:45 AM) 02/28/2017 9:44 AM Weight: 177.5 lb Height: 61in Body Surface Area: 1.8 m Body Mass Index: 33.54 kg/m  Temp.: 98.72F  Pulse: 89 (Regular)  BP: 136/88 (Sitting, Left Arm, Standard)      Physical Exam Minerva Areola(Ryoma Nofziger M. Dio Giller MD; 02/28/2017 10:57 AM)  General Mental Status-Alert. General Appearance-Consistent with stated age. Hydration-Well hydrated. Voice-Normal. Note: overweight  Head and Neck Head-normocephalic, atraumatic with no lesions or palpable masses. Trachea-midline. Thyroid Gland Characteristics - normal size and consistency.  Eye Eyeball -  Bilateral-Normal. Sclera/Conjunctiva - Bilateral-No scleral icterus.  ENMT Ears -Note:normal external ears.  Mouth and Throat -Note:lips intact.   Chest and Lung Exam Chest and lung exam reveals -quiet, even and easy respiratory effort with no use of accessory muscles and on auscultation, normal breath sounds, no adventitious sounds and normal vocal resonance. Inspection Chest Wall - Normal. Back - normal.  Breast - Did not examine.  Cardiovascular Cardiovascular examination reveals -normal heart sounds, regular rate and rhythm with no murmurs and normal pedal pulses bilaterally.  Abdomen Inspection Inspection of the abdomen reveals - No Hernias. Skin - Scar - Note: well healed trocar scars; abdominoplasty scars. Palpation/Percussion Palpation and Percussion of the abdomen reveal - Soft, Non Tender, No Rebound tenderness, No Rigidity (guarding) and No hepatosplenomegaly. Auscultation Auscultation of the abdomen reveals - Bowel sounds normal.  Peripheral Vascular Upper Extremity Palpation - Pulses bilaterally normal.  Neurologic Neurologic evaluation reveals -alert and oriented x 3 with no impairment of recent or remote memory. Mental Status-Normal.  Neuropsychiatric The patient's mood and affect are described as -normal. Judgment and Insight-insight is appropriate concerning matters relevant to self.  Musculoskeletal Normal Exam - Left-Upper Extremity Strength Normal and Lower Extremity Strength Normal. Normal Exam - Right-Upper Extremity Strength Normal and Lower Extremity Strength Normal.  Lymphatic Head & Neck  General Head & Neck Lymphatics: Bilateral - Description - Normal. Axillary - Did not examine. Femoral & Inguinal - Did not examine.    Assessment & Plan Minerva Areola(Lewie Deman M. 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 Continue taking daily vitamins and supplements.  OVERWEIGHT  (E66.3) Impression: I commended her on her commitment to exercise. We discussed how important this is for long-term success. It also appears that she is making good food choices.  Current Plans Pt Education - EMW_preopbariatric GASTROESOPHAGEAL REFLUX DISEASE, ESOPHAGITIS PRESENCE NOT SPECIFIED (K21.9) Impression: She has ongoing reflux and heartburn symptoms despite  maximal medical therapy. She is also concerned about long-term ramifications of PPI therapy. She has undergone an extensive workup and evaluation for her heartburn and reflux. She has undergone upper GI, upper endoscopy, pH impedance probe, and esophageal motility. After reviewing all this evidence I believe the patient would be best served by being converted to a Roux-en-Y gastric bypass to alleviate her heartburn. Based on available literature I believe this is the best option for her.  HEARTBURN (R12) Impression: The patient meets weight loss surgery criteria. I think the patient would be an acceptable candidate for Laparoscopic Roux-en-Y Gastric bypass.  We discussed laparoscopic Roux-en-Y gastric bypass. We discussed the preoperative, operative and postoperative process. Using diagrams, I explained the surgery in detail including the performance of an EGD near the end of the surgery. We discussed the typical hospital course including a 2-3 day stay baring any complications. The patient was given educational material. I quoted the patient that they can expect to lose 50-70% of their excess weight with the gastric bypass. We did discuss the possibility of weight regain several years after the procedure.  We discussed the risk and benefits of surgery including but not limited to anesthesia risk, bleeding, infection, anastomotic edema requiring a few additional days in the hospital, postop nausea, possible conversion to open procedure, blood clot formation, anastomotic leak, anastomotic stricture, ulcer formation, death, respiratory  complications, intestinal blockage, internal hernia, gallstone formation, vitamin and nutritional deficiencies, hair loss, weight regain injury to surrounding structures, failure to lose weight and mood changes.  We did discuss that since this is revisional surgery she is slightly higher risk for stricture, ulcer, leak, injury to surrounding structures, and readmission. We also discussed the possibility of persistent heartburn  HIATAL HERNIA (K44.9) Impression: We discussed the findings of a small hiatal hernia on imaging. I advised her that we will test for one intraoperatively. If found to have a clinically significant defect between her left and right crura then I would recommend fixing it. We discussed that would involve sewing the left and right crura back together with suture without mesh  Mary SellaEric M. Andrey CampanileWilson, MD, FACS General, Bariatric, & Minimally Invasive Surgery [facility] [place]

## 2017-02-28 NOTE — H&P (View-Only) (Signed)
Kayla Cooley 02/28/2017 9:44 AM Location: [facility] Patient #: [id number] DOB: October 07, 1970 Married / Language: [place] / Race: White Female  History of Present Illness Kayla Areola M. [name] MD; 02/28/2017 10:56 AM) The patient is a 46 year old female presenting status-post bariatric surgery. She comes in today for preoperative appointment. I last saw her in May. She has been approved for revisional surgery for conversion from a sleeve gastrectomy to and Roux-en-Y gastric bypass for persistent reflux. She states her reflux is still daily. It is worse with solids but less with liquids. It is not improved. She is still taking Protonix as well as toms multiple times a day. She denies any issues with food or liquids getting stuck. She denies any burping or belching. She denies any medical changes since I last saw her in May. She did have a follow-up upper GI in January which raised the possibility of a questionable small sliding hernia as well as demonstrating inducible reflux. She denies any tobacco use or NSAID use. She denies any fever, chills, vomiting or abdominal pain. She denies any chest pain, chest pressure, shortness of breath, dyspnea on exertion.  Review of systems a conference a 12 point review systems was performed and all systems are negative except for what is mentioned in HPI   08/04/2016 She comes in for long-term follow-up after undergoing laparoscopic sleeve gastrectomy by Dr. Biagio Quint on January 13, 2013. Her preoperative weight was 204 pounds. I last saw her in January. She continues to struggle with heartburn. Liquids exacerbate her heartburn. Dexilant too expensive so back on protonix. she having symptoms despite PPI bid. She underwent esophageal motility in April which showed a normal resting GE junction pressure. Esophageal contractions were 100% peristaltic with normal distal latency and distal contractile integral. No evidence of a hiatal hernia. Normal  basal and residual upper esophageal pressures. Normal relaxation of the GE junction. No significant esophageal peristaltic abnormality detected on this study. She had a pH impedance study done as well which showed good acid suppression, slightly elevated weekly acid reflux events; positive symptom correlation with weekly acid reflux events based on symptom association probability. She is having difficulty drinking water because it makes it feel more acidy. Solids make her symptoms feel better. She will have sensation of acid refluxing back into her upper throat but does not regurgitate food. She also complains of a hoarse voice.  She had an upper GI in November 2016 when she started having some initial reflux symptoms. Esophageal peristalsis was normal. No hiatal hernia was seen. The gastroesophageal junction was noted to be patulous resulting in spontaneous significant reflux. On migrainous review the upper GI her gastric sleeve was also quite symmetrically enlarged. Upper endoscopy in February showed reflux disease and biopsy showed chronic inflammation. There is no narrowing or stricture in her sleeve gastrectomy however her sleeve was quite generous       Problem List/Past Medical Kayla Areola M. 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 Kayla Campanile, MD; 02/28/2017 11:08 AM) Hysterectomy (not due to cancer) - Partial Knee Surgery Bilateral. Mammoplasty; Reduction Bilateral. Oral Surgery  Diagnostic Studies History Kayla Areola M. Kayla Campanile, MD; 02/28/2017 11:08 AM) Colonoscopy 1-5 years ago Pap Smear 1-5 years ago Mammogram within last year  Allergies (Kayla Cooley, RMA; 02/28/2017  9:45 AM) Aspirin *ANALGESICS - NonNarcotic* Cashew  Nut Oil *CHEMICALS* Allergies Reconciled  Medication History Kayla Areola([name] Kayla CampanileWilson, MD; 02/28/2017 11:08 AM) Pantoprazole Sodium (40MG  Tablet DR, 1 (one) Oral two times daily, Taken starting 01/12/2017) Active. Biotin (1MG  Capsule, Oral) Active. Calcium (500MG  Capsule, Oral) Active. Multiple Vitamin (Oral) Active. Vitamin B12-Folic Acid (500-400MCG Tablet, Oral) Active. Medications Reconciled OxyCODONE HCl (5MG /5ML Solution, 5-10 Milliliter Oral every four hours, as needed, Taken starting 02/28/2017) Active. Zofran (4MG  Tablet, 1 (one) Tablet Oral every eight hours, as needed, Taken starting 02/28/2017) Active.  Social History Kayla Areola([name] M. Kayla CampanileWilson, MD; 02/28/2017 11:08 AM) Alcohol use Moderate alcohol use. Tobacco use Never smoker. Caffeine use Coffee. No drug use  Family History Kayla Areola(Kayla Cooley M. Kayla CampanileWilson, MD; 02/28/2017 11:08 AM) Hypertension Father, Mother. Thyroid problems Daughter, Mother. Breast Cancer Family Members In General. Heart Disease Father.  Pregnancy / Birth History Kayla Areola(Kayla Cooley M. Kayla CampanileWilson, MD; 02/28/2017 11:08 AM) Para 1 Maternal age 47-20 Irregular periods Age at menarche 10 years. Gravida 1  Other Problems Kayla Areola(Kayla Cooley M. Kayla CampanileWilson, MD; 02/28/2017 11:08 AM) Arthritis    Vitals (Kayla A. Brown RMA; 02/28/2017 9:45 AM) 02/28/2017 9:44 AM Weight: 177.5 lb Height: 61in Body Surface Area: 1.8 m Body Mass Index: 33.54 kg/m  Temp.: 98.72F  Pulse: 89 (Regular)  BP: 136/88 (Sitting, Left Arm, Standard)      Physical Exam Kayla Areola([name] [name] MD; 02/28/2017 10:57 AM)  General Mental Status-Alert. General Appearance-Consistent with stated age. Hydration-Well hydrated. Voice-Normal. Note: overweight  Head and Neck Head-normocephalic, atraumatic with no lesions or palpable masses. Trachea-midline. Thyroid Gland Characteristics - normal size and consistency.  Eye Eyeball -  Bilateral-Normal. Sclera/Conjunctiva - Bilateral-No scleral icterus.  ENMT Ears -Note:normal external ears.  Mouth and Throat -Note:lips intact.   Chest and Lung Exam Chest and lung exam reveals -quiet, even and easy respiratory effort with no use of accessory muscles and on auscultation, normal breath sounds, no adventitious sounds and normal vocal resonance. Inspection Chest Wall - Normal. Back - normal.  Breast - Did not examine.  Cardiovascular Cardiovascular examination reveals -normal heart sounds, regular rate and rhythm with no murmurs and normal pedal pulses bilaterally.  Abdomen Inspection Inspection of the abdomen reveals - No Hernias. Skin - Scar - Note: well healed trocar scars; abdominoplasty scars. Palpation/Percussion Palpation and Percussion of the abdomen reveal - Soft, Non Tender, No Rebound tenderness, No Rigidity (guarding) and No hepatosplenomegaly. Auscultation Auscultation of the abdomen reveals - Bowel sounds normal.  Peripheral Vascular Upper Extremity Palpation - Pulses bilaterally normal.  Neurologic Neurologic evaluation reveals -alert and oriented x 3 with no impairment of recent or remote memory. Mental Status-Normal.  Neuropsychiatric The patient's mood and affect are described as -normal. Judgment and Insight-insight is appropriate concerning matters relevant to self.  Musculoskeletal Normal Exam - Left-Upper Extremity Strength Normal and Lower Extremity Strength Normal. Normal Exam - Right-Upper Extremity Strength Normal and Lower Extremity Strength Normal.  Lymphatic Head & Neck  General Head & Neck Lymphatics: Bilateral - Description - Normal. Axillary - Did not examine. Femoral & Inguinal - Did not examine.    Assessment & Plan Kayla Areola(Tami Blass M. 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 Continue taking daily vitamins and supplements.  OVERWEIGHT  (E66.3) Impression: I commended her on her commitment to exercise. We discussed how important this is for long-term success. It also appears that she is making good food choices.  Current Plans Pt Education - EMW_preopbariatric GASTROESOPHAGEAL REFLUX DISEASE, ESOPHAGITIS PRESENCE NOT SPECIFIED (K21.9) Impression: She has ongoing reflux and heartburn symptoms despite  maximal medical therapy. She is also concerned about long-term ramifications of PPI therapy. She has undergone an extensive workup and evaluation for her heartburn and reflux. She has undergone upper GI, upper endoscopy, pH impedance probe, and esophageal motility. After reviewing all this evidence I believe the patient would be best served by being converted to a Roux-en-Y gastric bypass to alleviate her heartburn. Based on available literature I believe this is the best option for her.  HEARTBURN (R12) Impression: The patient meets weight loss surgery criteria. I think the patient would be an acceptable candidate for Laparoscopic Roux-en-Y Gastric bypass.  We discussed laparoscopic Roux-en-Y gastric bypass. We discussed the preoperative, operative and postoperative process. Using diagrams, I explained the surgery in detail including the performance of an EGD near the end of the surgery. We discussed the typical hospital course including a 2-3 day stay baring any complications. The patient was given educational material. I quoted the patient that they can expect to lose 50-70% of their excess weight with the gastric bypass. We did discuss the possibility of weight regain several years after the procedure.  We discussed the risk and benefits of surgery including but not limited to anesthesia risk, bleeding, infection, anastomotic edema requiring a few additional days in the hospital, postop nausea, possible conversion to open procedure, blood clot formation, anastomotic leak, anastomotic stricture, ulcer formation, death, respiratory  complications, intestinal blockage, internal hernia, gallstone formation, vitamin and nutritional deficiencies, hair loss, weight regain injury to surrounding structures, failure to lose weight and mood changes.  We did discuss that since this is revisional surgery she is slightly higher risk for stricture, ulcer, leak, injury to surrounding structures, and readmission. We also discussed the possibility of persistent heartburn  HIATAL HERNIA (K44.9) Impression: We discussed the findings of a small hiatal hernia on imaging. I advised her that we will test for one intraoperatively. If found to have a clinically significant defect between her left and right crura then I would recommend fixing it. We discussed that would involve sewing the left and right crura back together with suture without mesh  Mary SellaEric M. Kayla CampanileWilson, MD, FACS General, Bariatric, & Minimally Invasive Surgery [facility], [place]

## 2017-03-01 NOTE — Progress Notes (Signed)
ekg 09/20/16 epic  cxr 09/20/16 epic

## 2017-03-01 NOTE — Patient Instructions (Signed)
Esmeralda ArthurKimberly Antu  03/01/2017   Your procedure is scheduled on: 03-06-27  [facility]  Entrance [place]  elevators to 3rd floor to  Short Stay Center at     [phone].    Call this number if you have problems the morning of surgery [phone]    Remember: ONLY 1 PERSON MAY GO WITH YOU TO SHORT STAY TO GET  READY MORNING OF YOUR SURGERY.  Do not eat food or drink liquids :After Midnight.     Take these medicines the morning of surgery with A SIP OF WATER: protonix, zyrtec                                You may not have any metal on your body including hair pins and              piercings  Do not wear jewelry, make-up, lotions, powders or perfumes, deodorant             Do not wear nail polish.  Do not shave  48 hours prior to surgery.            Do not bring valuables to the hospital. [facility] IS NOT             RESPONSIBLE   FOR VALUABLES.  Contacts, dentures or bridgework may not be worn into surgery.  Leave suitcase in the car. After surgery it may be brought to your room.                  Please read over the following fact sheets you were given: _____________________________________________________________________          [facility] - Pre[facility] Before surgery, you can play an important role.  Because skin is not sterile, your skin needs to be as free of germs as possible.  You can reduce the number of germs on your skin by washing with CHG (chlorahexidine gluconate) soap before surgery.  CHG is an antiseptic cleaner which kills germs and bonds with the skin to continue killing germs even after washing. Please DO NOT use if you have an allergy to CHG or antibacterial soaps.  If your skin becomes reddened/irritated stop using the CHG and inform your nurse when you arrive at Short Stay. Do not shave (including legs and underarms) for at least 48 hours prior to the first CHG shower.  You may shave your face/neck. Please  follow these instructions carefully:  1.  Shower with CHG Soap the night before surgery and the  morning of Surgery.  2.  If you choose to wash your hair, wash your hair first as usual with your  normal  shampoo.  3.  After you shampoo, rinse your hair and body thoroughly to remove the  shampoo.                           4.  Use CHG as you would any other liquid soap.  You can apply chg directly  to the skin and wash                       Gently with a scrungie or clean washcloth.  5.  Apply the CHG Soap to your body ONLY FROM THE NECK  DOWN.   Do not use on face/ open                           Wound or open sores. Avoid contact with eyes, ears mouth and genitals (private parts).                       Wash face,  Genitals (private parts) with your normal soap.             6.  Wash thoroughly, paying special attention to the area where your surgery  will be performed.  7.  Thoroughly rinse your body with warm water from the neck down.  8.  DO NOT shower/wash with your normal soap after using and rinsing off  the CHG Soap.                9.  Pat yourself dry with a clean towel.            10.  Wear clean pajamas.            11.  Place clean sheets on your bed the night of your first shower and do not  sleep with pets. Day of Surgery : Do not apply any lotions/deodorants the morning of surgery.  Please wear clean clothes to the hospital/surgery center.  FAILURE TO FOLLOW THESE INSTRUCTIONS MAY RESULT IN THE CANCELLATION OF YOUR SURGERY PATIENT SIGNATURE_________________________________  NURSE SIGNATURE__________________________________  ________________________________________________________________________  WHAT IS A BLOOD TRANSFUSION? Blood Transfusion Information  A transfusion is the replacement of blood or some of its parts. Blood is made up of multiple cells which provide different functions.  Red blood cells carry oxygen and are used for blood loss replacement.  White blood cells  fight against infection.  Platelets control bleeding.  Plasma helps clot blood.  Other blood products are available for specialized needs, such as hemophilia or other clotting disorders. BEFORE THE TRANSFUSION  Who gives blood for transfusions?   Healthy volunteers who are fully evaluated to make sure their blood is safe. This is blood bank blood. Transfusion therapy is the safest it has ever been in the practice of medicine. Before blood is taken from a donor, a complete history is taken to make sure that person has no history of diseases nor engages in risky social behavior (examples are intravenous drug use or sexual activity with multiple partners). The donor's travel history is screened to minimize risk of transmitting infections, such as malaria. The donated blood is tested for signs of infectious diseases, such as HIV and hepatitis. The blood is then tested to be sure it is compatible with you in order to minimize the chance of a transfusion reaction. If you or a relative donates blood, this is often done in anticipation of surgery and is not appropriate for emergency situations. It takes many days to process the donated blood. RISKS AND COMPLICATIONS Although transfusion therapy is very safe and saves many lives, the main dangers of transfusion include:   Getting an infectious disease.  Developing a transfusion reaction. This is an allergic reaction to something in the blood you were given. Every precaution is taken to prevent this. The decision to have a blood transfusion has been considered carefully by your caregiver before blood is given. Blood is not given unless the benefits outweigh the risks. AFTER THE TRANSFUSION  Right after receiving a blood transfusion, you will usually feel much better and more energetic.  This is especially true if your red blood cells have gotten low (anemic). The transfusion raises the level of the red blood cells which carry oxygen, and this usually  causes an energy increase.  The nurse administering the transfusion will monitor you carefully for complications. HOME CARE INSTRUCTIONS  No special instructions are needed after a transfusion. You may find your energy is better. Speak with your caregiver about any limitations on activity for underlying diseases you may have. SEEK MEDICAL CARE IF:   Your condition is not improving after your transfusion.  You develop redness or irritation at the intravenous (IV) site. SEEK IMMEDIATE MEDICAL CARE IF:  Any of the following symptoms occur over the next 12 hours:  Shaking chills.  You have a temperature by mouth above 102 F (38.9 C), not controlled by medicine.  Chest, back, or muscle pain.  People around you feel you are not acting correctly or are confused.  Shortness of breath or difficulty breathing.  Dizziness and fainting.  You get a rash or develop hives.  You have a decrease in urine output.  Your urine turns a dark color or changes to pink, red, or brown. Any of the following symptoms occur over the next 10 days:  You have a temperature by mouth above 102 F (38.9 C), not controlled by medicine.  Shortness of breath.  Weakness after normal activity.  The white part of the eye turns yellow (jaundice).  You have a decrease in the amount of urine or are urinating less often.  Your urine turns a dark color or changes to pink, red, or brown. Document Released: 03/10/2000 Document Revised: 06/05/2011 Document Reviewed: 10/28/2007 ExitCare Patient Information 2014 [place], [place].  _______________________________________________________________________  Incentive Spirometer  An incentive spirometer is a tool that can help keep your lungs clear and active. This tool measures how well you are filling your lungs with each breath. Taking long deep breaths may help reverse or decrease the chance of developing breathing (pulmonary) problems (especially infection)  following:  A long period of time when you are unable to move or be active. BEFORE THE PROCEDURE   If the spirometer includes an indicator to show your best effort, your nurse or respiratory therapist will set it to a desired goal.  If possible, sit up straight or lean slightly forward. Try not to slouch.  Hold the incentive spirometer in an upright position. INSTRUCTIONS FOR USE  1. Sit on the edge of your bed if possible, or sit up as far as you can in bed or on a chair. 2. Hold the incentive spirometer in an upright position. 3. Breathe out normally. 4. Place the mouthpiece in your mouth and seal your lips tightly around it. 5. Breathe in slowly and as deeply as possible, raising the piston or the ball toward the top of the column. 6. Hold your breath for 3-5 seconds or for as long as possible. Allow the piston or ball to fall to the bottom of the column. 7. Remove the mouthpiece from your mouth and breathe out normally. 8. Rest for a few seconds and repeat Steps 1 through 7 at least 10 times every 1-2 hours when you are awake. Take your time and take a few normal breaths between deep breaths. 9. The spirometer may include an indicator to show your best effort. Use the indicator as a goal to work toward during each repetition. 10. After each set of 10 deep breaths, practice coughing to be sure your lungs are clear.  If you have an incision (the cut made at the time of surgery), support your incision when coughing by placing a pillow or rolled up towels firmly against it. Once you are able to get out of bed, walk around indoors and cough well. You may stop using the incentive spirometer when instructed by your caregiver.  RISKS AND COMPLICATIONS  Take your time so you do not get dizzy or light-headed.  If you are in pain, you may need to take or ask for pain medication before doing incentive spirometry. It is harder to take a deep breath if you are having pain. AFTER USE  Rest and  breathe slowly and easily.  It can be helpful to keep track of a log of your progress. Your caregiver can provide you with a simple table to help with this. If you are using the spirometer at home, follow these instructions: SEEK MEDICAL CARE IF:   You are having difficultly using the spirometer.  You have trouble using the spirometer as often as instructed.  Your pain medication is not giving enough relief while using the spirometer.  You develop fever of 100.5 F (38.1 C) or higher. SEEK IMMEDIATE MEDICAL CARE IF:   You cough up bloody sputum that had not been present before.  You develop fever of 102 F (38.9 C) or greater.  You develop worsening pain at or near the incision site. MAKE SURE YOU:   Understand these instructions.  Will watch your condition.  Will get help right away if you are not doing well or get worse. Document Released: 07/24/2006 Document Revised: 06/05/2011 Document Reviewed: 09/24/2006 [facility] Patient Information 2014 [place], [place].   ________________________________________________________________________

## 2017-03-02 ENCOUNTER — Encounter (HOSPITAL_COMMUNITY)
Admission: RE | Admit: 2017-03-02 | Discharge: 2017-03-02 | Disposition: A | Discharge status: Home / Self Care | Payer: Managed Care, Other (non HMO) | Source: Ambulatory Visit | Attending: General Surgery | Admitting: General Surgery

## 2017-03-02 ENCOUNTER — Encounter (HOSPITAL_COMMUNITY): Payer: Self-pay

## 2017-03-02 ENCOUNTER — Other Ambulatory Visit: Payer: Self-pay

## 2017-03-02 LAB — CBC WITH DIFFERENTIAL/PLATELET
Basophils Absolute: 0 10*3/uL (ref 0.0–0.1)
Basophils Relative: 0 %
Eosinophils Absolute: 0.1 10*3/uL (ref 0.0–0.7)
Eosinophils Relative: 2 %
HCT: 40.1 % (ref 36.0–46.0)
HEMOGLOBIN: 13.2 g/dL (ref 12.0–15.0)
LYMPHS ABS: 1.6 10*3/uL (ref 0.7–4.0)
LYMPHS PCT: 27 %
MCH: 29.9 pg (ref 26.0–34.0)
MCHC: 32.9 g/dL (ref 30.0–36.0)
MCV: 90.9 fL (ref 78.0–100.0)
MONOS PCT: 10 %
Monocytes Absolute: 0.6 10*3/uL (ref 0.1–1.0)
NEUTROS PCT: 61 %
Neutro Abs: 3.7 10*3/uL (ref 1.7–7.7)
Platelets: 271 10*3/uL (ref 150–400)
RBC: 4.41 MIL/uL (ref 3.87–5.11)
RDW: 12.8 % (ref 11.5–15.5)
WBC: 6 10*3/uL (ref 4.0–10.5)

## 2017-03-02 LAB — COMPREHENSIVE METABOLIC PANEL
ALK PHOS: 47 U/L (ref 38–126)
ALT: 18 U/L (ref 14–54)
AST: 20 U/L (ref 15–41)
Albumin: 4.3 g/dL (ref 3.5–5.0)
Anion gap: 7 (ref 5–15)
BILIRUBIN TOTAL: 0.7 mg/dL (ref 0.3–1.2)
BUN: 13 mg/dL (ref 6–20)
CALCIUM: 9.1 mg/dL (ref 8.9–10.3)
CO2: 27 mmol/L (ref 22–32)
CREATININE: 0.62 mg/dL (ref 0.44–1.00)
Chloride: 106 mmol/L (ref 101–111)
Glucose, Bld: 87 mg/dL (ref 65–99)
Potassium: 4.4 mmol/L (ref 3.5–5.1)
Sodium: 140 mmol/L (ref 135–145)
TOTAL PROTEIN: 7.6 g/dL (ref 6.5–8.1)

## 2017-03-02 LAB — ABO/RH: ABO/RH(D): B POS

## 2017-03-03 DIAGNOSIS — Z9884 Bariatric surgery status: Secondary | ICD-10-CM | POA: Insufficient documentation

## 2017-03-04 NOTE — Anesthesia Preprocedure Evaluation (Addendum)
Anesthesia Evaluation  Patient identified by MRN, date of birth, ID band Patient awake    Reviewed: Allergy & Precautions, NPO status , Patient's Chart, lab work & pertinent test results  History of Anesthesia Complications (+) PONV and history of anesthetic complications  Airway Mallampati: I  TM Distance: >3 FB Neck ROM: full    Dental  (+) Teeth Intact, Dental Advisory Given   Pulmonary    breath sounds clear to auscultation       Cardiovascular hypertension,  Rhythm:Regular Rate:Normal     Neuro/Psych  Headaches,    GI/Hepatic GERD  Medicated,  Endo/Other  obese  Renal/GU      Musculoskeletal  (+) Arthritis ,   Abdominal   Peds  Hematology   Anesthesia Other Findings   Reproductive/Obstetrics                            Anesthesia Physical  Anesthesia Plan  ASA: II  Anesthesia Plan: General   Post-op Pain Management:    Induction: Intravenous  PONV Risk Score and Plan: 3 and Treatment may vary due to age or medical condition, Ondansetron, Dexamethasone, Scopolamine patch - Pre-op and Midazolam  Airway Management Planned: Oral ETT  Additional Equipment: None  Intra-op Plan:   Post-operative Plan: Extubation in OR  Informed Consent: I have reviewed the patients History and Physical, chart, labs and discussed the procedure including the risks, benefits and alternatives for the proposed anesthesia with the patient or authorized representative who has indicated his/her understanding and acceptance.   Dental advisory given  Plan Discussed with: CRNA, Anesthesiologist and Surgeon  Anesthesia Plan Comments: (  )       Anesthesia Quick Evaluation

## 2017-03-05 ENCOUNTER — Inpatient Hospital Stay (HOSPITAL_COMMUNITY): Payer: Managed Care, Other (non HMO) | Admitting: Anesthesiology

## 2017-03-05 ENCOUNTER — Inpatient Hospital Stay (HOSPITAL_COMMUNITY)
Admission: RE | Admit: 2017-03-05 | Discharge: 2017-03-07 | DRG: 327 | Disposition: A | Discharge status: Home / Self Care | Payer: Managed Care, Other (non HMO) | Source: Ambulatory Visit | Attending: General Surgery | Admitting: General Surgery

## 2017-03-05 ENCOUNTER — Encounter (HOSPITAL_COMMUNITY)
Admission: RE | Disposition: A | Discharge status: Home / Self Care | Payer: Self-pay | Source: Ambulatory Visit | Attending: General Surgery

## 2017-03-05 ENCOUNTER — Encounter (HOSPITAL_COMMUNITY): Payer: Self-pay | Admitting: *Deleted

## 2017-03-05 ENCOUNTER — Other Ambulatory Visit: Payer: Self-pay

## 2017-03-05 DIAGNOSIS — Y838 Other surgical procedures as the cause of abnormal reaction of the patient, or of later complication, without mention of misadventure at the time of the procedure: Secondary | ICD-10-CM | POA: Diagnosis present

## 2017-03-05 DIAGNOSIS — Z91018 Allergy to other foods: Secondary | ICD-10-CM | POA: Diagnosis not present

## 2017-03-05 DIAGNOSIS — E669 Obesity, unspecified: Secondary | ICD-10-CM | POA: Diagnosis present

## 2017-03-05 DIAGNOSIS — K449 Diaphragmatic hernia without obstruction or gangrene: Secondary | ICD-10-CM | POA: Diagnosis present

## 2017-03-05 DIAGNOSIS — R490 Dysphonia: Secondary | ICD-10-CM | POA: Diagnosis present

## 2017-03-05 DIAGNOSIS — I10 Essential (primary) hypertension: Secondary | ICD-10-CM | POA: Diagnosis present

## 2017-03-05 DIAGNOSIS — E7800 Pure hypercholesterolemia, unspecified: Secondary | ICD-10-CM | POA: Diagnosis present

## 2017-03-05 DIAGNOSIS — Z79899 Other long term (current) drug therapy: Secondary | ICD-10-CM

## 2017-03-05 DIAGNOSIS — Z8249 Family history of ischemic heart disease and other diseases of the circulatory system: Secondary | ICD-10-CM | POA: Diagnosis not present

## 2017-03-05 DIAGNOSIS — Z886 Allergy status to analgesic agent status: Secondary | ICD-10-CM

## 2017-03-05 DIAGNOSIS — R110 Nausea: Secondary | ICD-10-CM

## 2017-03-05 DIAGNOSIS — Z90710 Acquired absence of both cervix and uterus: Secondary | ICD-10-CM

## 2017-03-05 DIAGNOSIS — K912 Postsurgical malabsorption, not elsewhere classified: Secondary | ICD-10-CM | POA: Diagnosis present

## 2017-03-05 DIAGNOSIS — Z6833 Body mass index (BMI) 33.0-33.9, adult: Secondary | ICD-10-CM

## 2017-03-05 DIAGNOSIS — Z9884 Bariatric surgery status: Secondary | ICD-10-CM | POA: Diagnosis not present

## 2017-03-05 DIAGNOSIS — K210 Gastro-esophageal reflux disease with esophagitis: Secondary | ICD-10-CM | POA: Diagnosis present

## 2017-03-05 DIAGNOSIS — K9189 Other postprocedural complications and disorders of digestive system: Secondary | ICD-10-CM | POA: Diagnosis present

## 2017-03-05 HISTORY — PX: GASTRIC ROUX-EN-Y: SHX5262

## 2017-03-05 LAB — TYPE AND SCREEN
ABO/RH(D): B POS
ANTIBODY SCREEN: NEGATIVE

## 2017-03-05 LAB — HEMOGLOBIN AND HEMATOCRIT, BLOOD
HEMATOCRIT: 39 % (ref 36.0–46.0)
HEMOGLOBIN: 13.1 g/dL (ref 12.0–15.0)

## 2017-03-05 LAB — GLUCOSE, CAPILLARY: Glucose-Capillary: 152 mg/dL — ABNORMAL HIGH (ref 65–99)

## 2017-03-05 SURGERY — LAPAROSCOPIC ROUX-EN-Y GASTRIC BYPASS WITH UPPER ENDOSCOPY
Anesthesia: General | Site: Abdomen

## 2017-03-05 MED ORDER — PROPOFOL 10 MG/ML IV BOLUS
INTRAVENOUS | Status: AC
  Filled 2017-03-05: qty 20

## 2017-03-05 MED ORDER — CEFOTETAN DISODIUM-DEXTROSE 2-2.08 GM-%(50ML) IV SOLR
INTRAVENOUS | Status: AC
  Filled 2017-03-05: qty 50

## 2017-03-05 MED ORDER — ROCURONIUM BROMIDE 50 MG/5ML IV SOSY
PREFILLED_SYRINGE | INTRAVENOUS | Status: DC | PRN
  Administered 2017-03-05 (×3): 10 mg via INTRAVENOUS
  Administered 2017-03-05: 50 mg via INTRAVENOUS
  Administered 2017-03-05: 10 mg via INTRAVENOUS
  Administered 2017-03-05: 20 mg via INTRAVENOUS
  Administered 2017-03-05: 10 mg via INTRAVENOUS

## 2017-03-05 MED ORDER — DEXAMETHASONE SODIUM PHOSPHATE 10 MG/ML IJ SOLN
INTRAMUSCULAR | Status: AC
  Filled 2017-03-05: qty 1

## 2017-03-05 MED ORDER — HEPARIN SODIUM (PORCINE) 5000 UNIT/ML IJ SOLN
5000.0000 [IU] | INTRAMUSCULAR | Status: AC
  Administered 2017-03-05: 5000 [IU] via SUBCUTANEOUS
  Filled 2017-03-05: qty 1

## 2017-03-05 MED ORDER — ENALAPRILAT 1.25 MG/ML IV SOLN
1.2500 mg | Freq: Four times a day (QID) | INTRAVENOUS | Status: DC | PRN
  Administered 2017-03-05: 1.25 mg via INTRAVENOUS
  Filled 2017-03-05 (×3): qty 1

## 2017-03-05 MED ORDER — FENTANYL CITRATE (PF) 100 MCG/2ML IJ SOLN
INTRAMUSCULAR | Status: DC | PRN
  Administered 2017-03-05: 25 ug via INTRAVENOUS
  Administered 2017-03-05: 50 ug via INTRAVENOUS
  Administered 2017-03-05: 25 ug via INTRAVENOUS
  Administered 2017-03-05: 100 ug via INTRAVENOUS

## 2017-03-05 MED ORDER — BUPIVACAINE LIPOSOME 1.3 % IJ SUSP: Freq: Once | INTRAMUSCULAR | Status: DC

## 2017-03-05 MED ORDER — KCL IN DEXTROSE-NACL 20-5-0.45 MEQ/L-%-% IV SOLN
INTRAVENOUS | Status: DC
  Administered 2017-03-05: 1000 mL via INTRAVENOUS
  Administered 2017-03-05 – 2017-03-06 (×2): via INTRAVENOUS
  Administered 2017-03-06 – 2017-03-07 (×3): 1000 mL via INTRAVENOUS
  Filled 2017-03-05 (×6): qty 1000

## 2017-03-05 MED ORDER — CHLORHEXIDINE GLUCONATE 4 % EX LIQD: 60.0000 mL | Freq: Once | CUTANEOUS | Status: DC

## 2017-03-05 MED ORDER — ACETAMINOPHEN 500 MG PO TABS
1000.0000 mg | ORAL_TABLET | ORAL | Status: AC
  Administered 2017-03-05: 1000 mg via ORAL
  Filled 2017-03-05: qty 2

## 2017-03-05 MED ORDER — LIDOCAINE 2% (20 MG/ML) 5 ML SYRINGE
INTRAMUSCULAR | Status: AC
  Filled 2017-03-05: qty 5

## 2017-03-05 MED ORDER — APREPITANT 40 MG PO CAPS
40.0000 mg | ORAL_CAPSULE | ORAL | Status: AC
  Administered 2017-03-05: 40 mg via ORAL
  Filled 2017-03-05: qty 1

## 2017-03-05 MED ORDER — ONDANSETRON HCL 4 MG/2ML IJ SOLN
INTRAMUSCULAR | Status: AC
  Filled 2017-03-05: qty 2

## 2017-03-05 MED ORDER — MORPHINE SULFATE (PF) 4 MG/ML IV SOLN
1.0000 mg | INTRAVENOUS | Status: DC | PRN
  Administered 2017-03-05: 1 mg via INTRAVENOUS
  Filled 2017-03-05: qty 1

## 2017-03-05 MED ORDER — BUPIVACAINE LIPOSOME 1.3 % IJ SUSP
20.0000 mL | Freq: Once | INTRAMUSCULAR | Status: DC
  Filled 2017-03-05: qty 20

## 2017-03-05 MED ORDER — ROCURONIUM BROMIDE 50 MG/5ML IV SOSY
PREFILLED_SYRINGE | INTRAVENOUS | Status: AC
  Filled 2017-03-05: qty 5

## 2017-03-05 MED ORDER — LACTATED RINGERS IV SOLN
INTRAVENOUS | Status: DC | PRN
  Administered 2017-03-05 (×2): via INTRAVENOUS

## 2017-03-05 MED ORDER — SIMETHICONE 80 MG PO CHEW: 80.0000 mg | CHEWABLE_TABLET | Freq: Four times a day (QID) | ORAL | Status: DC | PRN

## 2017-03-05 MED ORDER — TISSEEL VH 10 ML EX KIT
PACK | CUTANEOUS | Status: AC
  Filled 2017-03-05: qty 1

## 2017-03-05 MED ORDER — PREMIER PROTEIN SHAKE
2.0000 [oz_av] | ORAL | Status: DC
  Administered 2017-03-07 (×2): 2 [oz_av] via ORAL
  Filled 2017-03-05 (×6): qty 325.31

## 2017-03-05 MED ORDER — LIDOCAINE 2% (20 MG/ML) 5 ML SYRINGE
INTRAMUSCULAR | Status: DC | PRN
  Administered 2017-03-05: 80 mg via INTRAVENOUS

## 2017-03-05 MED ORDER — SCOPOLAMINE 1 MG/3DAYS TD PT72
1.0000 | MEDICATED_PATCH | TRANSDERMAL | Status: DC
  Administered 2017-03-05: 1.5 mg via TRANSDERMAL
  Filled 2017-03-05: qty 1

## 2017-03-05 MED ORDER — ACETAMINOPHEN 160 MG/5ML PO SOLN
650.0000 mg | Freq: Four times a day (QID) | ORAL | Status: DC
  Administered 2017-03-05 – 2017-03-07 (×7): 650 mg via ORAL
  Filled 2017-03-05 (×7): qty 20.3

## 2017-03-05 MED ORDER — SUCCINYLCHOLINE CHLORIDE 200 MG/10ML IV SOSY
PREFILLED_SYRINGE | INTRAVENOUS | Status: DC | PRN
  Administered 2017-03-05: 120 mg via INTRAVENOUS

## 2017-03-05 MED ORDER — ENOXAPARIN SODIUM 30 MG/0.3ML ~~LOC~~ SOLN
30.0000 mg | Freq: Two times a day (BID) | SUBCUTANEOUS | Status: DC
  Administered 2017-03-05 – 2017-03-07 (×4): 30 mg via SUBCUTANEOUS
  Filled 2017-03-05 (×4): qty 0.3

## 2017-03-05 MED ORDER — SUGAMMADEX SODIUM 200 MG/2ML IV SOLN
INTRAVENOUS | Status: DC | PRN
  Administered 2017-03-05: 200 mg via INTRAVENOUS

## 2017-03-05 MED ORDER — MIDAZOLAM HCL 2 MG/2ML IJ SOLN
INTRAMUSCULAR | Status: AC
  Filled 2017-03-05: qty 2

## 2017-03-05 MED ORDER — OXYCODONE HCL 5 MG/5ML PO SOLN: 5.0000 mg | ORAL | Status: DC | PRN

## 2017-03-05 MED ORDER — DEXAMETHASONE SODIUM PHOSPHATE 10 MG/ML IJ SOLN
INTRAMUSCULAR | Status: DC | PRN
  Administered 2017-03-05: 10 mg via INTRAVENOUS

## 2017-03-05 MED ORDER — MIDAZOLAM HCL 2 MG/2ML IJ SOLN
INTRAMUSCULAR | Status: DC | PRN
  Administered 2017-03-05: 2 mg via INTRAVENOUS

## 2017-03-05 MED ORDER — ROCURONIUM BROMIDE 50 MG/5ML IV SOSY: PREFILLED_SYRINGE | INTRAVENOUS | Status: DC | PRN

## 2017-03-05 MED ORDER — FENTANYL CITRATE (PF) 100 MCG/2ML IJ SOLN
INTRAMUSCULAR | Status: AC
  Filled 2017-03-05: qty 2

## 2017-03-05 MED ORDER — BUPIVACAINE LIPOSOME 1.3 % IJ SUSP
INTRAMUSCULAR | Status: DC | PRN
  Administered 2017-03-05: 20 mL

## 2017-03-05 MED ORDER — DEXAMETHASONE SODIUM PHOSPHATE 4 MG/ML IJ SOLN: 4.0000 mg | INTRAMUSCULAR | Status: DC

## 2017-03-05 MED ORDER — ONDANSETRON HCL 4 MG/2ML IJ SOLN
4.0000 mg | Freq: Four times a day (QID) | INTRAMUSCULAR | Status: DC | PRN
  Administered 2017-03-06: 4 mg via INTRAVENOUS
  Filled 2017-03-05: qty 2

## 2017-03-05 MED ORDER — SCOPOLAMINE 1 MG/3DAYS TD PT72
MEDICATED_PATCH | TRANSDERMAL | Status: AC
  Filled 2017-03-05: qty 1

## 2017-03-05 MED ORDER — CEFOTETAN DISODIUM-DEXTROSE 2-2.08 GM-%(50ML) IV SOLR
2.0000 g | INTRAVENOUS | Status: AC
  Administered 2017-03-05: 2 g via INTRAVENOUS

## 2017-03-05 MED ORDER — PROMETHAZINE HCL 25 MG/ML IJ SOLN: 12.5000 mg | Freq: Four times a day (QID) | INTRAMUSCULAR | Status: DC | PRN

## 2017-03-05 MED ORDER — 0.9 % SODIUM CHLORIDE (POUR BTL) OPTIME
TOPICAL | Status: DC | PRN
  Administered 2017-03-05: 1000 mL

## 2017-03-05 MED ORDER — TISSEEL VH 10 ML EX KIT
PACK | CUTANEOUS | Status: DC | PRN
  Administered 2017-03-05: 1

## 2017-03-05 MED ORDER — GABAPENTIN 300 MG PO CAPS
300.0000 mg | ORAL_CAPSULE | ORAL | Status: AC
  Administered 2017-03-05: 300 mg via ORAL
  Filled 2017-03-05: qty 1

## 2017-03-05 MED ORDER — ONDANSETRON HCL 4 MG/2ML IJ SOLN: 4.0000 mg | Freq: Once | INTRAMUSCULAR | Status: DC | PRN

## 2017-03-05 MED ORDER — SUCCINYLCHOLINE CHLORIDE 200 MG/10ML IV SOSY
PREFILLED_SYRINGE | INTRAVENOUS | Status: AC
  Filled 2017-03-05: qty 10

## 2017-03-05 MED ORDER — DIPHENHYDRAMINE HCL 50 MG/ML IJ SOLN: 12.5000 mg | Freq: Three times a day (TID) | INTRAMUSCULAR | Status: DC | PRN

## 2017-03-05 MED ORDER — MEPERIDINE HCL 50 MG/ML IJ SOLN: 6.2500 mg | INTRAMUSCULAR | Status: DC | PRN

## 2017-03-05 MED ORDER — SODIUM CHLORIDE 0.9 % IJ SOLN
INTRAMUSCULAR | Status: AC
  Filled 2017-03-05: qty 20

## 2017-03-05 MED ORDER — FENTANYL CITRATE (PF) 100 MCG/2ML IJ SOLN: 25.0000 ug | INTRAMUSCULAR | Status: DC | PRN

## 2017-03-05 MED ORDER — LACTATED RINGERS IR SOLN
Status: DC | PRN
  Administered 2017-03-05: 1000 mL

## 2017-03-05 MED ORDER — EVICEL 5 ML EX KIT
PACK | Freq: Once | CUTANEOUS | Status: DC
  Filled 2017-03-05: qty 1

## 2017-03-05 MED ORDER — SUGAMMADEX SODIUM 200 MG/2ML IV SOLN
INTRAVENOUS | Status: AC
  Filled 2017-03-05: qty 2

## 2017-03-05 MED ORDER — EVICEL 5 ML EX KIT
PACK | CUTANEOUS | Status: DC | PRN
  Administered 2017-03-05: 1

## 2017-03-05 MED ORDER — SODIUM CHLORIDE 0.9 % IJ SOLN
INTRAMUSCULAR | Status: AC
  Filled 2017-03-05: qty 50

## 2017-03-05 MED ORDER — GABAPENTIN 250 MG/5ML PO SOLN
200.0000 mg | Freq: Two times a day (BID) | ORAL | Status: DC
Start: 2017-03-05 — End: 2017-03-07
  Administered 2017-03-05 – 2017-03-06 (×4): 200 mg via ORAL
  Filled 2017-03-05 (×5): qty 4

## 2017-03-05 MED ORDER — PANTOPRAZOLE SODIUM 40 MG IV SOLR
40.0000 mg | Freq: Every day | INTRAVENOUS | Status: DC
  Administered 2017-03-05 – 2017-03-06 (×2): 40 mg via INTRAVENOUS
  Filled 2017-03-05 (×2): qty 40

## 2017-03-05 MED ORDER — PROPOFOL 10 MG/ML IV BOLUS
INTRAVENOUS | Status: DC | PRN
  Administered 2017-03-05: 150 mg via INTRAVENOUS

## 2017-03-05 MED ORDER — SODIUM CHLORIDE 0.9 % IJ SOLN
INTRAMUSCULAR | Status: DC | PRN
  Administered 2017-03-05: 50 mL via INTRAVENOUS

## 2017-03-05 MED ORDER — ONDANSETRON HCL 4 MG/2ML IJ SOLN
INTRAMUSCULAR | Status: DC | PRN
  Administered 2017-03-05: 4 mg via INTRAVENOUS

## 2017-03-05 SURGICAL SUPPLY — 73 items
APPLICATOR COTTON TIP 6IN STRL (MISCELLANEOUS) IMPLANT
APPLIER CLIP ROT 13.4 12 LRG (CLIP)
BLADE SURG SZ11 CARB STEEL (BLADE) ×3 IMPLANT
CABLE HIGH FREQUENCY MONO STRZ (ELECTRODE) ×3 IMPLANT
CHLORAPREP W/TINT 26ML (MISCELLANEOUS) ×3 IMPLANT
CLIP APPLIE ROT 13.4 12 LRG (CLIP) IMPLANT
CLIP SUT LAPRA TY ABSORB (SUTURE) ×9 IMPLANT
CUTTER FLEX LINEAR 45M (STAPLE) IMPLANT
DERMABOND ADVANCED (GAUZE/BANDAGES/DRESSINGS) ×2
DERMABOND ADVANCED .7 DNX12 (GAUZE/BANDAGES/DRESSINGS) ×1 IMPLANT
DEVICE SUT QUICK LOAD TK 5 (STAPLE) ×2 IMPLANT
DEVICE SUT TI-KNOT TK 5X26 (MISCELLANEOUS) ×2 IMPLANT
DEVICE SUTURE ENDOST 10MM (ENDOMECHANICALS) ×6 IMPLANT
DEVICE TI KNOT TK5 (MISCELLANEOUS) ×1
DISSECTOR BLUNT TIP ENDO 5MM (MISCELLANEOUS) ×3 IMPLANT
DRAIN PENROSE 18X1/4 LTX STRL (WOUND CARE) ×3 IMPLANT
ELECT REM PT RETURN 15FT ADLT (MISCELLANEOUS) ×3 IMPLANT
GAUZE SPONGE 4X4 12PLY STRL (GAUZE/BANDAGES/DRESSINGS) IMPLANT
GAUZE SPONGE 4X4 16PLY XRAY LF (GAUZE/BANDAGES/DRESSINGS) ×3 IMPLANT
GLOVE BIO SURGEON STRL SZ7.5 (GLOVE) ×6 IMPLANT
GLOVE INDICATOR 8.0 STRL GRN (GLOVE) ×3 IMPLANT
GOWN STRL REUS W/TWL XL LVL3 (GOWN DISPOSABLE) ×12 IMPLANT
HOLDER FOLEY CATH W/STRAP (MISCELLANEOUS) ×3 IMPLANT
HOVERMATT SINGLE USE (MISCELLANEOUS) ×3 IMPLANT
KIT BASIN OR (CUSTOM PROCEDURE TRAY) ×3 IMPLANT
KIT CLEAN ENDO COMPLIANCE (KITS) ×3 IMPLANT
KIT GASTRIC LAVAGE 34FR ADT (SET/KITS/TRAYS/PACK) ×3 IMPLANT
LUBRICANT JELLY K Y 4OZ (MISCELLANEOUS) IMPLANT
MARKER SKIN DUAL TIP RULER LAB (MISCELLANEOUS) ×3 IMPLANT
NEEDLE SPNL 22GX3.5 QUINCKE BK (NEEDLE) ×3 IMPLANT
PACK CARDIOVASCULAR III (CUSTOM PROCEDURE TRAY) ×3 IMPLANT
QUICK LOAD TK 5 (STAPLE) ×1
RELOAD 45 VASCULAR/THIN (ENDOMECHANICALS) IMPLANT
RELOAD ENDO STITCH 2.0 (ENDOMECHANICALS) ×22
RELOAD STAPLE TA45 3.5 REG BLU (ENDOMECHANICALS) IMPLANT
RELOAD STAPLER BLUE 60MM (STAPLE) ×1 IMPLANT
RELOAD STAPLER GOLD 60MM (STAPLE) ×2 IMPLANT
RELOAD STAPLER GREEN 60MM (STAPLE) ×1 IMPLANT
RELOAD STAPLER WHITE 60MM (STAPLE) ×2 IMPLANT
SCISSORS LAP 5X45 EPIX DISP (ENDOMECHANICALS) ×3 IMPLANT
SEALANT SURGICAL APPL DUAL CAN (MISCELLANEOUS) ×3 IMPLANT
SET IRRIG TUBING LAPAROSCOPIC (IRRIGATION / IRRIGATOR) ×3 IMPLANT
SHEARS HARMONIC ACE PLUS 45CM (MISCELLANEOUS) ×3 IMPLANT
SLEEVE XCEL OPT CAN 5 100 (ENDOMECHANICALS) ×3 IMPLANT
SOLUTION ANTI FOG 6CC (MISCELLANEOUS) ×3 IMPLANT
STAPLER ECHELON BIOABSB 60 FLE (MISCELLANEOUS) IMPLANT
STAPLER ECHELON LONG 60 440 (INSTRUMENTS) ×3 IMPLANT
STAPLER RELOAD BLUE 60MM (STAPLE) ×3
STAPLER RELOAD GOLD 60MM (STAPLE) ×6
STAPLER RELOAD GREEN 60MM (STAPLE) ×3
STAPLER RELOAD WHITE 60MM (STAPLE) ×6
SUT MNCRL AB 4-0 PS2 18 (SUTURE) ×3 IMPLANT
SUT RELOAD ENDO STITCH 2 48X1 (ENDOMECHANICALS) ×7
SUT RELOAD ENDO STITCH 2.0 (ENDOMECHANICALS) ×4
SUT SURGIDAC NAB ES-9 0 48 120 (SUTURE) ×9 IMPLANT
SUT VIC AB 2-0 SH 27 (SUTURE) ×2
SUT VIC AB 2-0 SH 27X BRD (SUTURE) ×1 IMPLANT
SUTURE RELOAD END STTCH 2 48X1 (ENDOMECHANICALS) ×7 IMPLANT
SUTURE RELOAD ENDO STITCH 2.0 (ENDOMECHANICALS) ×4 IMPLANT
SYR 10ML ECCENTRIC (SYRINGE) ×3 IMPLANT
SYR 20CC LL (SYRINGE) ×6 IMPLANT
TIP RIGID 35CM EVICEL (HEMOSTASIS) ×3 IMPLANT
TOWEL OR 17X26 10 PK STRL BLUE (TOWEL DISPOSABLE) ×3 IMPLANT
TOWEL OR NON WOVEN STRL DISP B (DISPOSABLE) ×3 IMPLANT
TRAY FOLEY CATH 14FRSI W/METER (CATHETERS) ×3 IMPLANT
TRAY FOLEY W/METER SILVER 16FR (SET/KITS/TRAYS/PACK) IMPLANT
TROCAR BLADELESS OPT 5 100 (ENDOMECHANICALS) ×3 IMPLANT
TROCAR UNIVERSAL OPT 12M 100M (ENDOMECHANICALS) ×9 IMPLANT
TROCAR XCEL 12X100 BLDLESS (ENDOMECHANICALS) ×3 IMPLANT
TUBING CONNECTING 10 (TUBING) ×4 IMPLANT
TUBING CONNECTING 10' (TUBING) ×2
TUBING ENDO SMARTCAP PENTAX (MISCELLANEOUS) ×3 IMPLANT
TUBING INSUF HEATED (TUBING) ×3 IMPLANT

## 2017-03-05 NOTE — Anesthesia Procedure Notes (Signed)
Date/Time: 03/05/2017 11:05 AM Performed by: Delphia Grateshandler, Johnedward Brodrick, CRNA Oxygen Delivery Method: Simple face mask Placement Confirmation: positive ETCO2 and breath sounds checked- equal and bilateral Dental Injury: Teeth and Oropharynx as per pre-operative assessment  Comments: Extubated --OA removed --- face mask

## 2017-03-05 NOTE — Anesthesia Postprocedure Evaluation (Signed)
Anesthesia Post Note  Patient: Esmeralda ArthurKimberly Meding  Procedure(s) Performed: Conversion to LAPAROSCOPIC ROUX-EN-Y GASTRIC BYPASS WITH UPPER ENDOSCOPY,  Hiatal Hernia Repair (N/A Abdomen)     Patient location during evaluation: PACU Anesthesia Type: General Level of consciousness: awake and alert Pain management: pain level controlled Vital Signs Assessment: post-procedure vital signs reviewed and stable Respiratory status: spontaneous breathing, nonlabored ventilation, respiratory function stable and patient connected to nasal cannula oxygen Cardiovascular status: blood pressure returned to baseline and stable Postop Assessment: no apparent nausea or vomiting Anesthetic complications: no    Last Vitals:  Vitals:   03/05/17 1114 03/05/17 1115  BP: (!) 144/114 (!) 144/85  Pulse: 76 78  Resp: 14 13  Temp:    SpO2: 100%     Last Pain:  Vitals:   03/05/17 0509  TempSrc: Oral                 Delailah Spieth

## 2017-03-05 NOTE — Anesthesia Procedure Notes (Signed)
Procedure Name: Intubation Date/Time: 03/05/2017 7:45 AM Performed by: Dione Booze, CRNA Pre-anesthesia Checklist: Suction available, Patient being monitored, Emergency Drugs available and Patient identified Patient Re-evaluated:Patient Re-evaluated prior to induction Oxygen Delivery Method: Circle system utilized Preoxygenation: Pre-oxygenation with 100% oxygen Induction Type: IV induction, Cricoid Pressure applied and Rapid sequence Laryngoscope Size: Mac and 4 Grade View: Grade I Tube type: Oral Tube size: 7.5 mm Number of attempts: 1 Airway Equipment and Method: Stylet Placement Confirmation: ETT inserted through vocal cords under direct vision,  positive ETCO2 and breath sounds checked- equal and bilateral Secured at: 22 cm Tube secured with: Tape Dental Injury: Teeth and Oropharynx as per pre-operative assessment

## 2017-03-05 NOTE — Interval H&P Note (Signed)
History and Physical Interval Note:  03/05/2017 6:58 AM  Kayla Cooley  has presented today for surgery, with the diagnosis of GERD, S/P Sleeve Gastrectomy  The various methods of treatment have been discussed with the patient and family. After consideration of risks, benefits and other options for treatment, the patient has consented to  Procedure(s): Conversion to LAPAROSCOPIC ROUX-EN-Y GASTRIC BYPASS WITH UPPER ENDOSCOPY, Possible Hiatal Hernia Repair (N/A) as a surgical intervention .  The patient's history has been reviewed, patient examined, no change in status, stable for surgery.  I have reviewed the patient's chart and labs.  Questions were answered to the patient's satisfaction.    Mary SellaEric M. Andrey CampanileWilson, MD, FACS General, Bariatric, & Minimally Invasive Surgery [facility]  Gaynelle AduEric Ezriel Boffa

## 2017-03-05 NOTE — Op Note (Signed)
Preoperative diagnosis: conversion from sleeve to Roux-en-Y gastric bypass  Postoperative diagnosis: Same   Procedure: Upper endoscopy   Surgeon: Berna Buehelsea A Reiana Poteet, M.D.  Anesthesia: Gen.   Description of procedure: The endoscopy was placed in the mouth and into the oropharynx and under endoscopic vision it was advanced to the esophagogastric junction. The pouch was insufflated and no bleeding or bubbles were seen. The GEJ was identified at 39cm from the teeth. The anastamosis appeared patent and hemostatic and was at 44cm from the teeth. No bleeding or leaks were detected. The scope was withdrawn without difficulty.   Berna Buehelsea A Shaunae Sieloff M.D. General, Bariatric, & Minimally Invasive Surgery [facility]

## 2017-03-05 NOTE — Transfer of Care (Signed)
Immediate Anesthesia Transfer of Care Note  Patient: Kayla Cooley  Procedure(s) Performed: Conversion to LAPAROSCOPIC ROUX-EN-Y GASTRIC BYPASS WITH UPPER ENDOSCOPY,  Hiatal Hernia Repair (N/A Abdomen)  Patient Location: PACU  Anesthesia Type:General  Level of Consciousness: awake and alert   Airway & Oxygen Therapy: Patient Spontanous Breathing and Patient connected to face mask oxygen  Post-op Assessment: Report given to RN and Post -op Vital signs reviewed and stable  Post vital signs: Reviewed and stable  Last Vitals:  Vitals:   03/05/17 0509  BP: (!) 147/97  Pulse: 68  Resp: 18  Temp: 36.8 C  SpO2: 100%    Last Pain:  Vitals:   03/05/17 0509  TempSrc: Oral      Patients Stated Pain Goal: 0 (03/05/17 0550)  Complications: No apparent anesthesia complications

## 2017-03-05 NOTE — Discharge Instructions (Signed)
° ° ° °GASTRIC BYPASS/SLEEVE ° Home Care Instructions ° ° These instructions are to help you care for yourself when you go home. ° °Call: If you have any problems. °• Call [phone] and ask for the surgeon on call °• If you need immediate help, come to the ER at [facility].  °• Tell the ER staff that you are a new post-op gastric bypass or gastric sleeve patient °  °Signs and symptoms to report: • Severe vomiting or nausea °o If you cannot keep down clear liquids for longer than 1 day, call your surgeon  °• Abdominal pain that does not get better after taking your pain medication °• Fever over 100.4° F with chills °• Heart beating over 100 beats a minute °• Shortness of breath at rest °• Chest pain °•  Redness, swelling, drainage, or foul odor at incision (surgical) sites °•  If your incisions open or pull apart °• Swelling or pain in calf (lower leg) °• Diarrhea (Loose bowel movements that happen often), frequent watery, uncontrolled bowel movements °• Constipation, (no bowel movements for 3 days) if this happens: Pick one °o Milk of Magnesia, 2 tablespoons by mouth, 3 times a day for 2 days if needed °o Stop taking Milk of Magnesia once you have a bowel movement °o Call your doctor if constipation continues °Or °o Miralax  (instead of Milk of Magnesia) following the label instructions °o Stop taking Miralax once you have a bowel movement °o Call your doctor if constipation continues °• Anything you think is not normal °  °Normal side effects after surgery: • Unable to sleep at night or unable to focus °• Irritability or moody °• Being tearful (crying) or depressed °These are common complaints, possibly related to your anesthesia medications that put you to sleep, stress of surgery, and change in lifestyle.  This usually goes away a few weeks after surgery.  If these feelings continue, call your primary care doctor. °  °Wound Care: You may have surgical glue, steri-strips, or staples over your incisions after  surgery °• Surgical glue:  Looks like a clear film over your incisions and will wear off a little at a time °• Steri-strips: Strips of tape over your incisions. You may notice a yellowish color on the skin under the steri-strips. This is used to make the   steri-strips stick better. Do not pull the steri-strips off - let them fall off °• Staples: Staples may be removed before you leave the hospital °o If you go home with staples, call [facility], [phone] at for an appointment with your surgeon’s nurse to have staples removed 10 days after surgery. °• Showering: You may shower two (2) days after your surgery unless your surgeon tells you differently °o Wash gently around incisions with warm soapy water, rinse well, and gently pat dry  °o No tub baths until staples are removed, steri-strips fall off or glue is gone.  °  °Medications: • Medications should be liquid or crushed if larger than the size of a dime °• Extended release pills (medication that release a little bit at a time through the day) should NOT be crushed or cut. (examples include XL, ER, DR, SR) °• Depending on the size and number of medications you take, you may need to space (take a few throughout the day)/change the time you take your medications so that you do not over-fill your pouch (smaller stomach) °• Make sure you follow-up with your primary care doctor to   make medication changes needed during rapid weight loss and life-style changes °• If you have diabetes, follow up with the doctor that orders your diabetes medication(s) within one week after surgery and check your blood sugar regularly. °• Do not drive while taking prescription pain medication  °• It is ok to take Tylenol by the bottle instructions with your pain medicine or instead of your pain medicine as needed.  DO NOT TAKE NSAIDS (EXAMPLES OF NSAIDS:  IBUPROFREN/ NAPROXEN)  °Diet:                    First 2 Weeks ° You will see the dietician t about two (2) weeks  after your surgery. The dietician will increase the types of foods you can eat if you are handling liquids well: °• If you have severe vomiting or nausea and cannot keep down clear liquids lasting longer than 1 day, call your surgeon @ ([phone]) °Protein Shake °• Drink at least 2 ounces of shake 5-6 times per day °• Each serving of protein shakes (usually 8 - 12 ounces) should have: °o 15 grams of protein  °o And no more than 5 grams of carbohydrate  °• Goal for protein each day: °o Men = 80 grams per day °o Women = 60 grams per day °• Protein powder may be added to fluids such as non-fat milk or Lactaid milk or unsweetened Soy/Almond milk (limit to 35 grams added protein powder per serving) ° °Hydration °• Slowly increase the amount of water and other clear liquids as tolerated (See Acceptable Fluids) °• Slowly increase the amount of protein shake as tolerated  °•  Sip fluids slowly and throughout the day.  Do not use straws. °• May use sugar substitutes in small amounts (no more than 6 - 8 packets per day; i.e. Splenda) ° °Fluid Goal °• The first goal is to drink at least 8 ounces of protein shake/drink per day (or as directed by the nutritionist); some examples of protein shakes are Syntrax Nectar, Adkins Advantage, EAS Edge HP, and Unjury. See handout from pre-op Bariatric Education Class: °o Slowly increase the amount of protein shake you drink as tolerated °o You may find it easier to slowly sip shakes throughout the day °o It is important to get your proteins in first °• Your fluid goal is to drink 64 - 100 ounces of fluid daily °o It may take a few weeks to build up to this °• 32 oz (or more) should be clear liquids  °And  °• 32 oz (or more) should be full liquids (see below for examples) °• Liquids should not contain sugar, caffeine, or carbonation ° °Clear Liquids: °• Water or Sugar-free flavored water (i.e. Fruit H2O, Propel) °• Decaffeinated coffee or tea (sugar-free) °• Crystal Lite, Wyler’s Lite,  Minute Maid Lite °• Sugar-free Jell-O °• Bouillon or broth °• Sugar-free Popsicle:   *Less than 20 calories each; Limit 1 per day ° °Full Liquids: °Protein Shakes/Drinks + 2 choices per day of other full liquids °• Full liquids must be: °o No More Than 15 grams of Carbs per serving  °o No More Than 3 grams of Fat per serving °• Strained low-fat cream soup (except Cream of Potato or Tomato) °• Non-Fat milk °• Fat-free Lactaid Milk °• Unsweetened Soy Or Unsweetened Almond Milk °• Low Sugar yogurt (Dannon Lite & Fit, Greek yogurt; Oikos Triple Zero; Chobani Simply 100; Yoplait 100 calorie Greek - No Fruit on the Bottom) ° °  °Vitamins   and Minerals • Start 1 day after surgery unless otherwise directed by your surgeon °• 2 Chewable Bariatric Specific Multivitamin / Multimineral Supplement with iron (Example: Bariatric Advantage Multi EA) °• Chewable Calcium with Vitamin D-3 °(Example: 3 Chewable Calcium Plus 600 with Vitamin D-3) °o Take 500 mg three (3) times a day for a total of 1500 mg each day °o Do not take all 3 doses of calcium at one time as it may cause constipation, and you can only absorb 500 mg  at a time  °o Do not mix multivitamins containing iron with calcium supplements; take 2 hours apart °• Menstruating women and those with a history of anemia (a blood disease that causes weakness) may need extra iron °o Talk with your doctor to see if you need more iron °• Do not stop taking or change any vitamins or minerals until you talk to your dietitian or surgeon °• Your Dietitian and/or surgeon must approve all vitamin and mineral supplements °  °Activity and Exercise: Limit your physical activity as instructed by your doctor.  It is important to continue walking at home.  During this time, use these guidelines: °• Do not lift anything greater than ten (10) pounds for at least two (2) weeks °• Do not go back to work or drive until your surgeon says you can °• You may have sex when you feel comfortable  °o It is  VERY important for female patients to use a reliable birth control method; fertility often increases after surgery  °o All hormonal birth control will be ineffective for 30 days after surgery due to medications given during surgery a barrier method must be used. °o Do not get pregnant for at least 18 months °• Start exercising as soon as your doctor tells you that you can °o Make sure your doctor approves any physical activity °• Start with a simple walking program °• Walk 5-15 minutes each day, 7 days per week.  °• Slowly increase until you are walking 30-45 minutes per day °Consider joining our BELT program. [phone] or email [email or web address] °  °Special Instructions Things to remember: °• Use your CPAP when sleeping if this applies to you ° °• [facility] has two free Bariatric Surgery Support Groups that meet monthly °o The 3rd Thursday of each month, 6 pm, [facility]  °o The 2nd Friday of each month, 11:45 am in the private dining room in the basement of [facility] °• It is very important to keep all follow up appointments with your surgeon, dietitian, primary care physician, and behavioral health practitioner °• Routine follow up schedule with your surgeon include appointments at 2-3 weeks, 6-8 weeks, 6 months, and 1 year at a minimum.  Your surgeon may request to see you more often.   °o After the first year, please follow up with your bariatric surgeon and dietitian at least once a year in order to maintain best weight loss results °[facility]: [phone] °[facility]: [phone] °Bariatric Nurse Coordinator: [phone] °  °   Reviewed and Endorsed  °by [place] Patient Education Committee, June, 2016 °Edits Approved: Aug, 2018 ° ° ° °

## 2017-03-05 NOTE — Progress Notes (Signed)
Discussed post op day goals with patient including ambulation, IS, diet progression, pain, and nausea control.  Questions answered. 

## 2017-03-05 NOTE — Op Note (Signed)
Kayla Cooley [id number] 1970/05/30. 03/05/2017  Preoperative diagnosis:  1. obesity (BMI 33)  2. H/o sleeve gastrectomy 2014 3. GERD 4. Hiatal hernia  Postoperative  diagnosis:  1. same  Surgical procedure: Conversion of sleeve gastrectomy to Laparoscopic Roux-en-Y gastric bypass (ante-colic, ante-gastric); upper endoscopy; hiatal hernia repair  Surgeon: Atilano InaEric M Marthena Whitmyer, M.D. FACS  Asst.: Phylliss Blakeshelsea Connor MD  Anesthesia: General plus Exparel  Complications: None   EBL: Minimal   Drains: None   Disposition: PACU in good condition   Indications for procedure: 46yo female with obesity who had previously undergone a laparoscopic sleeve gastrectomy in 2014 who had developed persistent gastroesophageal reflux disease and heartburn despite maximal medical therapy presented for conversion of sleeve gastrectomy to Roux-en-Y gastric bypass with possible hiatal hernia repair.   The patient's comorbidities are listed above. We discussed the risk and benefits of surgery including but not limited to anesthesia risk, bleeding, infection, blood clot formation, anastomotic leak, anastomotic stricture, ulcer formation, death, respiratory complications, intestinal blockage, internal hernia, gallstone formation, vitamin and nutritional deficiencies, injury to surrounding structures, failure to lose weight and mood changes.   Description of procedure: Patient is brought to the operating room and general anesthesia induced. The patient had received preoperative broad-spectrum IV antibiotics and subcutaneous heparin. The abdomen was widely sterilely prepped with Chloraprep and draped. Patient timeout was performed and correct patient and procedure confirmed. Access was obtained with a 5 mm Optiview trocar in the left upper quadrant and pneumoperitoneum established without difficulty. Under direct vision 12 mm trocars were placed laterally in the right upper quadrant, right upper quadrant midclavicular line,  and to the left and above the umbilicus for the camera port. A 5 mm trocar was placed laterally in the left upper quadrant. Exparel was infiltrated in bilateral lateral upper abdominal walls as a tap block. The patient was then placed in steep reversed Trendelenburg. Through a 5 mm subxiphoid site the [facility] was placed and the left lobe of the liver elevated with excellent exposure of the upper stomach and hiatus.   Her prior sleeve gastrectomy appeared normal.  There is no overt evidence of inflammation.  There is no evidence of significant retained fundus.  There is a small anterior dimple that was obviously visible. The calibration tube was placed in the oropharynx and guided down into the stomach by the CRNA. 10 mL of air was insufflated into the calibration balloon. The calibration tubing was then gently pulled back by the CRNA and it slid past the GE junction. At this point the calibration tubing was desufflated and pulled back into the esophagus. This confirmed my suspicion of a clinically significant hiatal hernia. The gastrohepatic ligament was incised with harmonic scalpel. The right crus was identified. We identified the crossing fat along the right crus. The adipose tissue just above this area was incised with harmonic scalpel. I then bluntly dissected out this area and identified the left crus. There was evidence of a hiatal hernia. I then mobilized the esophagus. The left and right crus were further mobilized with blunt dissection. I was then able to reapproximate the left and right crus with 0 Ethibond using an Endostitch suture device and securing it with a titanium tyknot.  We then had the CRNA readvanced the calibration tubing back into the stomach. 10 mL of air was insufflated into the calibration tube balloon. The calibration tube was then gently pulled back and there was resistance at the GE junction. The tube did not slide back up into the  esophagus. At this point the  calibration tubing was deflated and removed from the patient's body.  Some of the omentum had re-adhered to the edge of the sleeve along the remaining gastric curvature this was taken down with harmonic scalpel.  Then using a ruler and measured approximately 5 cm from the GE junction. Dissection was carried along the lesser curve at this point with the Harmonic scalpel working carefully back toward the lesser sac at right angles to the lesser curve. The free lesser sac was easily entered.  Then using an Ethicon Echelon stapler I stapled across the sleeve at this location with a 60 green load.  It should be noted that the width of her sleeve was approximately 6 cm.  There is a slight remaining portion of intact sleeve after this.  I then used a 60 gold and angled slightly upward and fired one more time.  This left a 5 cm pouch.  There was some bleeding along the staple line gastric pouch but nothing significant.  When visualizing the remaining remnant sleeve AKA excluded stomach there is a small piece of stomach that was still attached to the majority of the stomach I decided to transect this to make it a clean horizontal line.  This was done with a single firing of the blue load stapler.  A small gastric specimen was extracted and put on the back table.  At this point I decided to do the jejunojejunostomy. the omentum was brought into the upper abdomen and the transverse mesocolon elevated and the ligament of Treitz clearly identified. A 40 cm biliopancreatic limb was then carefully measured from the ligament of Treitz. The small intestine was divided at this point with a single firing of the white load linear stapler. A Penrose drain was sutured to the end of the Roux-en-Y limb for later identification. A 100 cm Roux-en-Y limb was then carefully measured. At this point a side-to-side anastomosis was created between the Roux limb and the end of the biliopancreatic limb. This was accomplished with a single firing  of the 60 mm white load linear stapler. The common enterotomy was closed with a running 2-0 Vicryl begun at either end of the enterotomy and tied centrally. Eviceal tissue sealant was placed over the anastomosis. The mesenteric defect was then closed with running 2-0 silk. The omentum was then divided with the harmonic scalpel up towards the transverse colon to allow mobility of the Roux limb toward the gastric pouch.     Placed the patient back in reverse Trendelenburg.  the Roux limb was then brought up in an antecolic fashion with the candycane facing to the patient's left without undue tension. The gastrojejunostomy was created with an initial posterior row of 2-0 Vicryl between the Roux limb and the staple line of the new gastric pouch. Enterotomies were then made in the gastric pouch and the Roux limb with the harmonic scalpel and at approximately 2-2-1/2 cm anastomosis was created with a single firing of the 60mm blue load linear stapler. The staple line was inspected and was intact without bleeding. The common enterotomy was then closed with running 2-0 Vicryl begun at either end and tied centrally. The Ewall tube was then easily passed through the anastomosis and an outer anterior layer of running 2-0 Vicryl was placed. The Ewald tube was removed. With the outlet of the gastrojejunostomy clamped and under saline irrigation the assistant performed upper endoscopy and with the gastric pouch tensely distended with air-there was no evidence of leak  on this test. The pouch was desufflated. The Vonita Mosseterson defect was closed with running 2-0 silk. The abdomen was inspected for any evidence of bleeding or bowel injury and everything looked fine. The Nathanson retractor was removed under direct vision after coating the anastomosis with Tisseel tissue sealant. All CO2 was evacuated and trochars removed. Skin incisions were closed with 4-0 monocryl in a subcuticular fashion followed by Dermabond. Sponge needle and  instrument counts were correct. The patient was taken to the PACU in good condition.    Mary SellaEric M. Andrey CampanileWilson, MD, FACS General, Bariatric, & Minimally Invasive Surgery [facility] [place]

## 2017-03-06 ENCOUNTER — Inpatient Hospital Stay (HOSPITAL_COMMUNITY): Payer: Managed Care, Other (non HMO)

## 2017-03-06 LAB — COMPREHENSIVE METABOLIC PANEL
ALK PHOS: 39 U/L (ref 38–126)
ALT: 78 U/L — AB (ref 14–54)
AST: 76 U/L — AB (ref 15–41)
Albumin: 3.6 g/dL (ref 3.5–5.0)
Anion gap: 7 (ref 5–15)
BUN: 6 mg/dL (ref 6–20)
CHLORIDE: 104 mmol/L (ref 101–111)
CO2: 25 mmol/L (ref 22–32)
CREATININE: 0.49 mg/dL (ref 0.44–1.00)
Calcium: 8.5 mg/dL — ABNORMAL LOW (ref 8.9–10.3)
GFR calc Af Amer: >60 mL/min (ref >60)
GFR calc non Af Amer: >60 mL/min (ref >60)
GLUCOSE: 124 mg/dL — AB (ref 65–99)
Potassium: 3.7 mmol/L (ref 3.5–5.1)
SODIUM: 136 mmol/L (ref 135–145)
Total Bilirubin: 0.8 mg/dL (ref 0.3–1.2)
Total Protein: 6.7 g/dL (ref 6.5–8.1)

## 2017-03-06 LAB — CBC WITH DIFFERENTIAL/PLATELET
BASOS ABS: 0 10*3/uL (ref 0.0–0.1)
Basophils Relative: 0 %
EOS ABS: 0 10*3/uL (ref 0.0–0.7)
EOS PCT: 0 %
HCT: 38.3 % (ref 36.0–46.0)
HEMOGLOBIN: 12.9 g/dL (ref 12.0–15.0)
Lymphocytes Relative: 10 %
Lymphs Abs: 1.5 10*3/uL (ref 0.7–4.0)
MCH: 30.1 pg (ref 26.0–34.0)
MCHC: 33.7 g/dL (ref 30.0–36.0)
MCV: 89.5 fL (ref 78.0–100.0)
MONOS PCT: 9 %
Monocytes Absolute: 1.3 10*3/uL — ABNORMAL HIGH (ref 0.1–1.0)
NEUTROS PCT: 81 %
Neutro Abs: 11.9 10*3/uL — ABNORMAL HIGH (ref 1.7–7.7)
PLATELETS: 256 10*3/uL (ref 150–400)
RBC: 4.28 MIL/uL (ref 3.87–5.11)
RDW: 12.6 % (ref 11.5–15.5)
WBC: 14.6 10*3/uL — AB (ref 4.0–10.5)

## 2017-03-06 MED ORDER — IOPAMIDOL (ISOVUE-300) INJECTION 61%
INTRAVENOUS | Status: AC
  Administered 2017-03-06: 09:00:00 40 mL via ORAL
  Filled 2017-03-06: qty 50

## 2017-03-06 NOTE — Plan of Care (Signed)

## 2017-03-06 NOTE — Progress Notes (Signed)
Patient ID: Kayla Cooley, female   DOB: 1970/11/15, 46 y.o.   MRN: [id number]   Progress Note: Metabolic and Bariatric Surgery Service   Chief Complaint/Subjective: Min to no nausea after this surgery - a first per the pt!; pain ok. Scheduled tylenol helps but can tell when it wears off. Protein shake feels better going down than the water. Ambulated   Objective: Vital signs in last 24 hours: Temp:  [98.3 F (36.8 C)-99.6 F (37.6 C)] 99.3 F (37.4 C) (12/11 0350) Pulse Rate:  [68-81] 73 (12/11 0350) Resp:  [12-16] 14 (12/11 0350) BP: (144-179)/(80-114) 150/92 (12/11 0350) SpO2:  [97 %-100 %] 98 % (12/11 0350) Last BM Date: 03/05/17(before sx)  Intake/Output from previous day: 12/10 0701 - 12/11 0700 In: 3840.4 [P.O.:530; I.V.:3310.4] Out: 2350 [Urine:2275; Blood:75] Intake/Output this shift: No intake/output data recorded.  Lungs: cta, symmetric  Cardiovascular: reg  Abd: soft, nd min TTP  Extremities: no edema; +SCDs  Neuro: nonfocal, nontoxic  Lab Results: CBC  Recent Labs    03/05/17 1308 03/06/17 0528  WBC  --  14.6*  HGB 13.1 12.9  HCT 39.0 38.3  PLT  --  256   BMET Recent Labs    03/06/17 0528  NA 136  K 3.7  CL 104  CO2 25  GLUCOSE 124*  BUN 6  CREATININE 0.49  CALCIUM 8.5*   PT/INR No results for input(s): LABPROT, INR in the last 72 hours. ABG No results for input(s): PHART, HCO3 in the last 72 hours.  Invalid input(s): PCO2, PO2  Studies/Results:  Anti-infectives: Anti-infectives (From admission, onward)   Start     Dose/Rate Route Frequency Ordered Stop   03/05/17 0644  cefoTEtan in Dextrose 5% (CEFOTAN) 2-2.08 GM-%(50ML) IVPB    Comments:  Randon GoldsmithJones, Rachel   : cabinet override      03/05/17 0644 03/05/17 0759   03/05/17 0532  cefoTEtan in Dextrose 5% (CEFOTAN) IVPB 2 g     2 g Intravenous On call to O.R. 03/05/17 0532 03/05/17 [id number]      Medications: Scheduled Meds: . acetaminophen (TYLENOL) oral liquid 160 mg/5 mL  650  mg Oral Q6H  . enoxaparin (LOVENOX) injection  30 mg Subcutaneous Q12H  . gabapentin  200 mg Oral Q12H  . pantoprazole (PROTONIX) IV  40 mg Intravenous QHS  . [START ON 03/07/2017] protein supplement shake  2 oz Oral Q2H   Continuous Infusions: . dextrose 5 % and 0.45 % NaCl with KCl 20 mEq/L 1,000 mL (03/06/17 0530)   PRN Meds:.diphenhydrAMINE, enalaprilat, morphine injection, ondansetron (ZOFRAN) IV, oxyCODONE, promethazine, simethicone  Assessment/Plan: Patient Active Problem List   Diagnosis Date Noted  . Morbid obesity (HCC) 03/05/2017  . Gastroesophageal reflux disease   . 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 No fever. No tachycardia Tolerating clears Will get UGI this am to document 'new' anatomy Resume diet afterwards Since this was a revision case, will plan on dc Wednesday  Disposition:  LOS: 1 day  The patient will be in the hospital for normal postop protocol  Gaynelle AduEric Maclin Guerrette, MD [phone] [facility]

## 2017-03-06 NOTE — Progress Notes (Signed)
Patient alert and oriented, Post op day 1.  Provided support and encouragement.  Encouraged pulmonary toilet, ambulation and small sips of liquids.  Patient completed 12 ounces of clear fluids started 2 ounces protein.  Patient to have UGI this am for anatomy discussed holding off on protein shake until UGI completed.  All questions answered.  Will continue to monitor.

## 2017-03-07 LAB — CBC WITH DIFFERENTIAL/PLATELET
BASOS ABS: 0 10*3/uL (ref 0.0–0.1)
Basophils Relative: 0 %
Eosinophils Absolute: 0.1 10*3/uL (ref 0.0–0.7)
Eosinophils Relative: 1 %
HEMATOCRIT: 38.5 % (ref 36.0–46.0)
HEMOGLOBIN: 13 g/dL (ref 12.0–15.0)
LYMPHS PCT: 12 %
Lymphs Abs: 1.3 10*3/uL (ref 0.7–4.0)
MCH: 30.4 pg (ref 26.0–34.0)
MCHC: 33.8 g/dL (ref 30.0–36.0)
MCV: 90 fL (ref 78.0–100.0)
Monocytes Absolute: 0.8 10*3/uL (ref 0.1–1.0)
Monocytes Relative: 8 %
NEUTROS ABS: 8.2 10*3/uL — AB (ref 1.7–7.7)
NEUTROS PCT: 79 %
PLATELETS: 245 10*3/uL (ref 150–400)
RBC: 4.28 MIL/uL (ref 3.87–5.11)
RDW: 12.6 % (ref 11.5–15.5)
WBC: 10.4 10*3/uL (ref 4.0–10.5)

## 2017-03-07 MED ORDER — OXYCODONE HCL 5 MG/5ML PO SOLN: 5.0000 mg | ORAL | 0 refills | Status: DC | PRN

## 2017-03-07 NOTE — Discharge Summary (Signed)
Physician Discharge Summary  Esmeralda ArthurKimberly Briones ZOX:[id number] DOB: May 11, 1970 DOA: 03/05/2017  PCP: Tarri FullerEscajeda, Richard, MD  Admit date: 03/05/2017 Discharge date:  03/07/2017   Recommendations for Outpatient Follow-up:    Follow-up Information    Gaynelle AduWilson, Toshi Ishii, MD. Go on 03/29/2017.   Specialty:  General Surgery Why:  at 3 Contact information: [address] [place] [address] [phone]        Gaynelle AduWilson, Karolynn Infantino, MD Follow up.   Specialty:  General Surgery Contact information: [address] [place] [address] [phone]          Discharge Diagnoses:  Active Problems:   Morbid obesity (HCC) GERD refractory to medical management after sleeve gastrectomy Small hiatal hernia  Surgical Procedure: Laparoscopic conversion to Roux-en-Y gastric bypass, hiatal hernia repair, upper endoscopy  Discharge Condition: Good Disposition: Home  Diet recommendation: Postoperative gastric bypass diet  Filed Weights   03/05/17 0537  Weight: 78.7 kg (173 lb 9.6 oz)     Hospital Course:  The patient was admitted for a planned laparoscopic Roux-en-Y gastric bypass. She had previously undergone sleeve gastrectomy in 2014 but developed severe reflux refractory to medical mgmt. Please see operative note. Preoperatively the patient was given 5000 units of subcutaneous heparin for DVT prophylaxis. ERAS protocol was used. Postoperative prophylactic Lovenox dosing was started on the evening of postoperative day 0.  The patient was started on ice chips and water on the evening of POD 0 which they tolerated. On POD 1 the pt underwent an UGI since this was a revisional case. On postoperative day 1 The patient's diet was advanced to protein shakes which they also tolerated. On POD 2, The patient was ambulating without difficulty. Their vital signs are stable without fever or tachycardia. Their hemoglobin had remained stable.  She had less gas pain on day 2, had had a BM. The  patient had received discharge instructions and counseling. They were deemed stable for discharge.  BP (!) 157/62 (BP Location: Right Arm)   Pulse 67   Temp 98.2 F (36.8 C) (Oral)   Resp 18   Ht 5\' 1"  (1.549 m)   Wt 78.7 kg (173 lb 9.6 oz)   SpO2 99%   BMI 32.80 kg/m   Gen: alert, NAD, non-toxic appearing Pupils: equal, no scleral icterus Pulm: Lungs clear to auscultation, symmetric chest rise CV: regular rate and rhythm Abd: soft, min tender, nondistended. No cellulitis. No incisional hernia Ext: no edema, no calf tenderness Skin: no rash, no jaundice  Discharge Instructions  Discharge Instructions    Ambulate hourly while awake   Complete by:  As directed    Call MD for:  difficulty breathing, headache or visual disturbances   Complete by:  As directed    Call MD for:  persistant dizziness or light-headedness   Complete by:  As directed    Call MD for:  persistant nausea and vomiting   Complete by:  As directed    Call MD for:  redness, tenderness, or signs of infection (pain, swelling, redness, odor or green/yellow discharge around incision site)   Complete by:  As directed    Call MD for:  severe uncontrolled pain   Complete by:  As directed    Call MD for:  temperature >101 F   Complete by:  As directed    Diet bariatric full liquid   Complete by:  As directed    Discharge instructions   Complete by:  As directed  See bariatric discharge instructions   Incentive spirometry   Complete by:  As directed    Perform hourly while awake     Allergies as of 03/07/2017      Reactions   Aspirin Hives   Cashew Nut Oil Hives      Medication List    TAKE these medications   acetaminophen 500 MG tablet Commonly known as:  TYLENOL Take 1,000 mg by mouth every 6 (six) hours as needed for moderate pain or headache.   calcium carbonate 750 MG chewable tablet Commonly known as:  TUMS EX Chew 1 tablet by mouth daily.   cetirizine 10 MG tablet Commonly known as:   ZYRTEC Take 10 mg by mouth daily.   multivitamin with minerals Tabs tablet Take 1 tablet by mouth daily. (Either bariatric or prenatal)   oxyCODONE 5 MG/5ML solution Commonly known as:  ROXICODONE Take 5-10 mLs (5-10 mg total) by mouth every 4 (four) hours as needed for moderate pain or severe pain.   pantoprazole 40 MG tablet Commonly known as:  PROTONIX Take 40 mg by mouth See admin instructions. Scheduled each morning & in the evening as needed for indigestion/heartburn.   vitamin B-12 500 MCG tablet Commonly known as:  CYANOCOBALAMIN Take 500 mcg by mouth 3 (three) times a week.      Follow-up Information    Gaynelle Adu, MD. Go on 03/29/2017.   Specialty:  General Surgery Why:  at 3 Contact information: [address] [place] [address] [phone]        Gaynelle Adu, MD Follow up.   Specialty:  General Surgery Contact information: [address] [place] [address] [phone]            The results of significant diagnostics from this hospitalization (including imaging, microbiology, ancillary and laboratory) are listed below for reference.    Significant Diagnostic Studies: Dg Ugi W/water Sol Cm  Result Date: 03/06/2017 CLINICAL DATA:  46 year old female with history of prior sleeve gastrectomy converted to Roux-en-Y bypass. Postoperative evaluation. EXAM: WATER SOLUBLE UPPER GI SERIES TECHNIQUE: Single-column upper GI series was performed using water soluble contrast. CONTRAST:  40mL ISOVUE-300 IOPAMIDOL (ISOVUE-300) INJECTION 61% COMPARISON:  09/20/2016. FLUOROSCOPY TIME:  Fluoroscopy Time:  36 seconds Radiation Exposure Index (if provided by the fluoroscopic device): 11.6 mGy FINDINGS: Preprocedural KUB demonstrates a nonobstructive bowel gas pattern. Multiple suture lines are noted projecting over the left upper quadrant of the abdomen and left mid abdomen. No gross pneumoperitoneum. Upon ingestion of water soluble contrast the  esophagus was normal in appearance. A small gastric remnant was noted. Shortly after the contrast entered the gastric remnant, contrast traversed the anastomosis into the small bowel. No anastomotic leak was identified during the procedure. IMPRESSION: 1. Normal postoperative appearance following Roux-en-Y gastrectomy, as above. No evidence of obstruction or anastomotic leak. Electronically Signed   By: Trudie Reed M.D.   On: 03/06/2017 11:08    Labs: Basic Metabolic Panel: Recent Labs  Lab 03/02/17 1109 03/06/17 0528  NA 140 136  K 4.4 3.7  CL 106 104  CO2 27 25  GLUCOSE 87 124*  BUN 13 6  CREATININE 0.62 0.49  CALCIUM 9.1 8.5*   Liver Function Tests: Recent Labs  Lab 03/02/17 1109 03/06/17 0528  AST 20 76*  ALT 18 78*  ALKPHOS 47 39  BILITOT 0.7 0.8  PROT 7.6 6.7  ALBUMIN 4.3 3.6    CBC: Recent Labs  Lab 03/02/17 1109 03/05/17  1308 03/06/17 0528 03/07/17 0553  WBC 6.0  --  14.6* 10.4  NEUTROABS 3.7  --  11.9* 8.2*  HGB 13.2 13.1 12.9 13.0  HCT 40.1 39.0 38.3 38.5  MCV 90.9  --  89.5 90.0  PLT 271  --  256 245    CBG: Recent Labs  Lab 03/05/17 2100  GLUCAP 152*    Active Problems:   Morbid obesity (HCC)   Time coordinating discharge: 15 min  Signed:  Atilano InaEric M Jeri Jeanbaptiste, MD [facility] [place] [phone] 03/07/2017, 9:39 AM

## 2017-03-07 NOTE — Progress Notes (Signed)
Patient alert and oriented, pain is controlled. Patient is tolerating fluids, advanced to protein shake today, patient is tolerating well. Reviewed Gastric Bypass discharge instructions with patient and patient is able to articulate understanding. Provided information on BELT program, Support Group and [facility] outpatient pharmacy. All questions answered, will continue to monitor.    

## 2017-03-07 NOTE — Progress Notes (Signed)
Patient alert and oriented, Post op day 2.  Provided support and encouragement.  Encouraged pulmonary toilet, ambulation and small sips of liquids.  All questions answered.  Will continue to monitor. 

## 2017-03-09 ENCOUNTER — Telehealth (HOSPITAL_COMMUNITY): Payer: Self-pay

## 2017-03-09 NOTE — Telephone Encounter (Addendum)
Follow up with bariatric surgical patient to discuss post discharge questions.  No answer at this time.  Unable to leave voicemail, no voicemail available.  1.  Are you having any pain not relieved by pain medication?only using tylenol  2.  How much fluid total fluid intake have you had in the last 24/48 hours?  47 ounces of fluid  3.  How much protein intake have you had in the last 24/48 hours?52 grams of protein  4.  Have you had any trouble making urine?no  5.  Have you had nausea that has not been relieved by nausea medication?no  6.  Are you ambulating every hour?walking alot  7.  Are you passing gas or had a BM?having liquid bms they have slowed down, encouraged to call if worsen or do not continue to improve  8.  Do you know how to contact [facility]? [facility]? [facility]?yes  9.  Are you taking your vitamins and calcium without difficulty?yes  10. Tell me how your incision looks?  Any redness, open incision, or drainage?no problems

## 2017-03-13 ENCOUNTER — Encounter: Payer: Managed Care, Other (non HMO) | Attending: General Surgery | Admitting: Skilled Nursing Facility1

## 2017-03-13 DIAGNOSIS — Z6831 Body mass index (BMI) 31.0-31.9, adult: Secondary | ICD-10-CM | POA: Diagnosis not present

## 2017-03-13 DIAGNOSIS — Z713 Dietary counseling and surveillance: Secondary | ICD-10-CM | POA: Insufficient documentation

## 2017-03-13 DIAGNOSIS — E663 Overweight: Secondary | ICD-10-CM | POA: Diagnosis not present

## 2017-03-14 ENCOUNTER — Encounter: Payer: Self-pay | Admitting: Skilled Nursing Facility1

## 2017-03-14 NOTE — Progress Notes (Signed)
Bariatric Class:  Appt start time: 1530 end time:  1630.  2 Week Post-Operative Nutrition Class  Patient was seen on 03/13/2017 for Post-Operative Nutrition education [facility].   Pt was advised to weight for solid foods until her 2 week period. Pt report she has complete relief from GERD symptoms.   Surgery date: 03/05/2017 Surgery type: RYGB Start weight at [facility]: 168 Weight today: 168.8  TANITA  BODY COMP RESULTS  03/13/2017   BMI (kg/m^2) 31.9   Fat Mass (lbs) 70   Fat Free Mass (lbs) 98.8   Total Body Water (lbs) 69.8   The following the learning objectives were met by the patient during this course:  Identifies Phase 3A (Soft, High Proteins) Dietary Goals and will begin from 2 weeks post-operatively to 2 months post-operatively  Identifies appropriate sources of fluids and proteins   States protein recommendations and appropriate sources post-operatively  Identifies the need for appropriate texture modifications, mastication, and bite sizes when consuming solids  Identifies appropriate multivitamin and calcium sources post-operatively  Describes the need for physical activity post-operatively and will follow MD recommendations  States when to call healthcare provider regarding medication questions or post-operative complications  Handouts given during class include:  Phase 3A: Soft, High Protein Diet Handout  Follow-Up Plan: Patient will follow-up at [facility] in 6 weeks for 2 month post-op nutrition visit for diet advancement per MD.

## 2017-03-22 ENCOUNTER — Ambulatory Visit: Payer: Self-pay

## 2017-04-24 ENCOUNTER — Encounter: Payer: Managed Care, Other (non HMO) | Attending: General Surgery | Admitting: Registered"

## 2017-04-24 ENCOUNTER — Encounter: Payer: Self-pay | Admitting: Registered"

## 2017-04-24 DIAGNOSIS — E663 Overweight: Secondary | ICD-10-CM | POA: Diagnosis not present

## 2017-04-24 DIAGNOSIS — Z713 Dietary counseling and surveillance: Secondary | ICD-10-CM | POA: Diagnosis not present

## 2017-04-24 DIAGNOSIS — Z6831 Body mass index (BMI) 31.0-31.9, adult: Secondary | ICD-10-CM | POA: Diagnosis not present

## 2017-04-24 DIAGNOSIS — E669 Obesity, unspecified: Secondary | ICD-10-CM

## 2017-04-24 NOTE — Progress Notes (Signed)
Follow-up visit:  8 Weeks Post-Operative RYGB Surgery  Medical Nutrition Therapy:  Appt start time: 3:20 end time:  4:00.  Primary concerns today: Post-operative Bariatric Surgery Nutrition Management.  Non scale victories: no longer taking protonix, no more GERD, feels great, wearing smaller sizes  Surgery date: 03/05/2017 Surgery type: RYGB [facility] Weight today: 162.2 lbs Weight change: 6.6 lbs loss from 168.8 (03/13/2017) Total weight lost: 6.0 lbs Weight loss goal: ~150 lbs around 06/2017  TANITA  BODY COMP RESULTS  03/13/2017 04/24/2017   BMI (kg/m^2) 31.9 30.6   Fat Mass (lbs) 70 63.0   Fat Free Mass (lbs) 98.8 99.2   Total Body Water (lbs) 69.8 69.8   Pt reports she has complete relief from GERD symptoms even after not taking protonix. Pt states she doesn't tolerate canned chicken with lite mayo or pulled pork; vomits. Pt states she has had mashed potatoes and tolerated well. Pt states she tolerates protein that is more processed better than less processed protein options. Pt states she has a texture and smell issue with seafood, but willing to try some to see how she tolerates it. Pt states she used to like flounder and will try again soon. Pt states she is ready to start working out more, enjoys Education administratorcircuit training. Pt states she does not want to lose too fast; goal is to be around 150 lbs.   Preferred Learning Style:   No preference indicated   Learning Readiness:   Ready  Change in progress  24-hr recall: B (AM): 1/4 c egg beaters, cheese (11g) Snk (AM): protein shake (25g), decaf iced coffee  L (PM): 2 deli Malaw[place]urkey, 2 salami, cheese (18g) Snk (PM): trail mix (whole wheat chex, kashi GoLean crunch, raisins, sugar-free chocolate chips, peanuts) (7g) D (PM): chicken patty (9g) Snk (PM): popcorn  Fluid intake: decaf iced coffee, water, crystal lite decaf tea; 80+ oz Estimated total protein intake: 60+ grams  Medications: See list; no longer  protonix Supplementation: 2 Celebrate + 3 TUMS  Using straws: yes with no problems Drinking while eating: no Having you been chewing well:yes Chewing/swallowing difficulties: with chicken Changes in vision: no Changes to mood/headaches: no Hair loss/Cahnges to skin/Changes to nails: no, no, no Any difficulty focusing or concentrating: no Sweating: no Dizziness/Lightheaded: once when waiting too long to eat Palpitations: no  Carbonated beverages: no N/V/D/C/GAS: no, yes to chicken and pork, no, no, no Abdominal Pain: no Dumping syndrome: no Last Lap-Band fill: N/A  Recent physical activity:  Cardio 45 min, 3-4 days/week  Progress Towards Goal(s):  In progress.  Handouts given during visit include:  Phase 3B: High Protein + NS vegetables   Nutritional Diagnosis:  Beattie-3.3 Overweight/obesity related to past poor dietary habits and physical inactivity as evidenced by patient w/ recent RYGB surgery following dietary guidelines for continued weight loss.     Intervention:  Nutrition education and counseling. Goals:  Follow Phase 3B: High Protein + Non-Starchy Vegetables  Eat 3-6 small meals/snacks, every 3-5 hrs  Increase lean protein foods to meet 60g goal  Increase fluid intake to 64oz +  Avoid drinking 15 minutes before, during and 30 minutes after eating  Aim for >30 min of physical activity daily - Try tuna, flounder, or other seafood. Aim for less seasoned options especially when eating out.   Teaching Method Utilized:  Visual Auditory Hands on  Barriers to learning/adherence to lifestyle change: none identified  Demonstrated degree of understanding via:  Teach Back   Monitoring/Evaluation:  Dietary intake, exercise, lap band fills, and body weight. Follow up in 4 months for 6 month post-op visit.

## 2017-04-24 NOTE — Patient Instructions (Addendum)
Goals:  Follow Phase 3B: High Protein + Non-Starchy Vegetables  Eat 3-6 small meals/snacks, every 3-5 hrs  Increase lean protein foods to meet 60g goal  Increase fluid intake to 64oz +  Avoid drinking 15 minutes before, during and 30 minutes after eating  Aim for >30 min of physical activity daily  - Try tuna, flounder, or other seafood. Aim for less seasoned options especially when eating out.

## 2017-08-21 ENCOUNTER — Ambulatory Visit: Payer: Self-pay | Admitting: Registered"

## 2018-03-12 ENCOUNTER — Ambulatory Visit: Payer: Self-pay

## 2018-03-31 IMAGING — CR DG CHEST 2V
2 series · 2 of 2 positions shown · non-contrast
Comparison: Chest x-ray of January 10, 2013

CLINICAL DATA: Pre bariatric workup. No chest complaints. History
of hypertension in the past but not currently experiencing this.
Nonsmoker. History of gastroesophageal reflux.

EXAM:
CHEST  2 VIEW

[w chest pa]
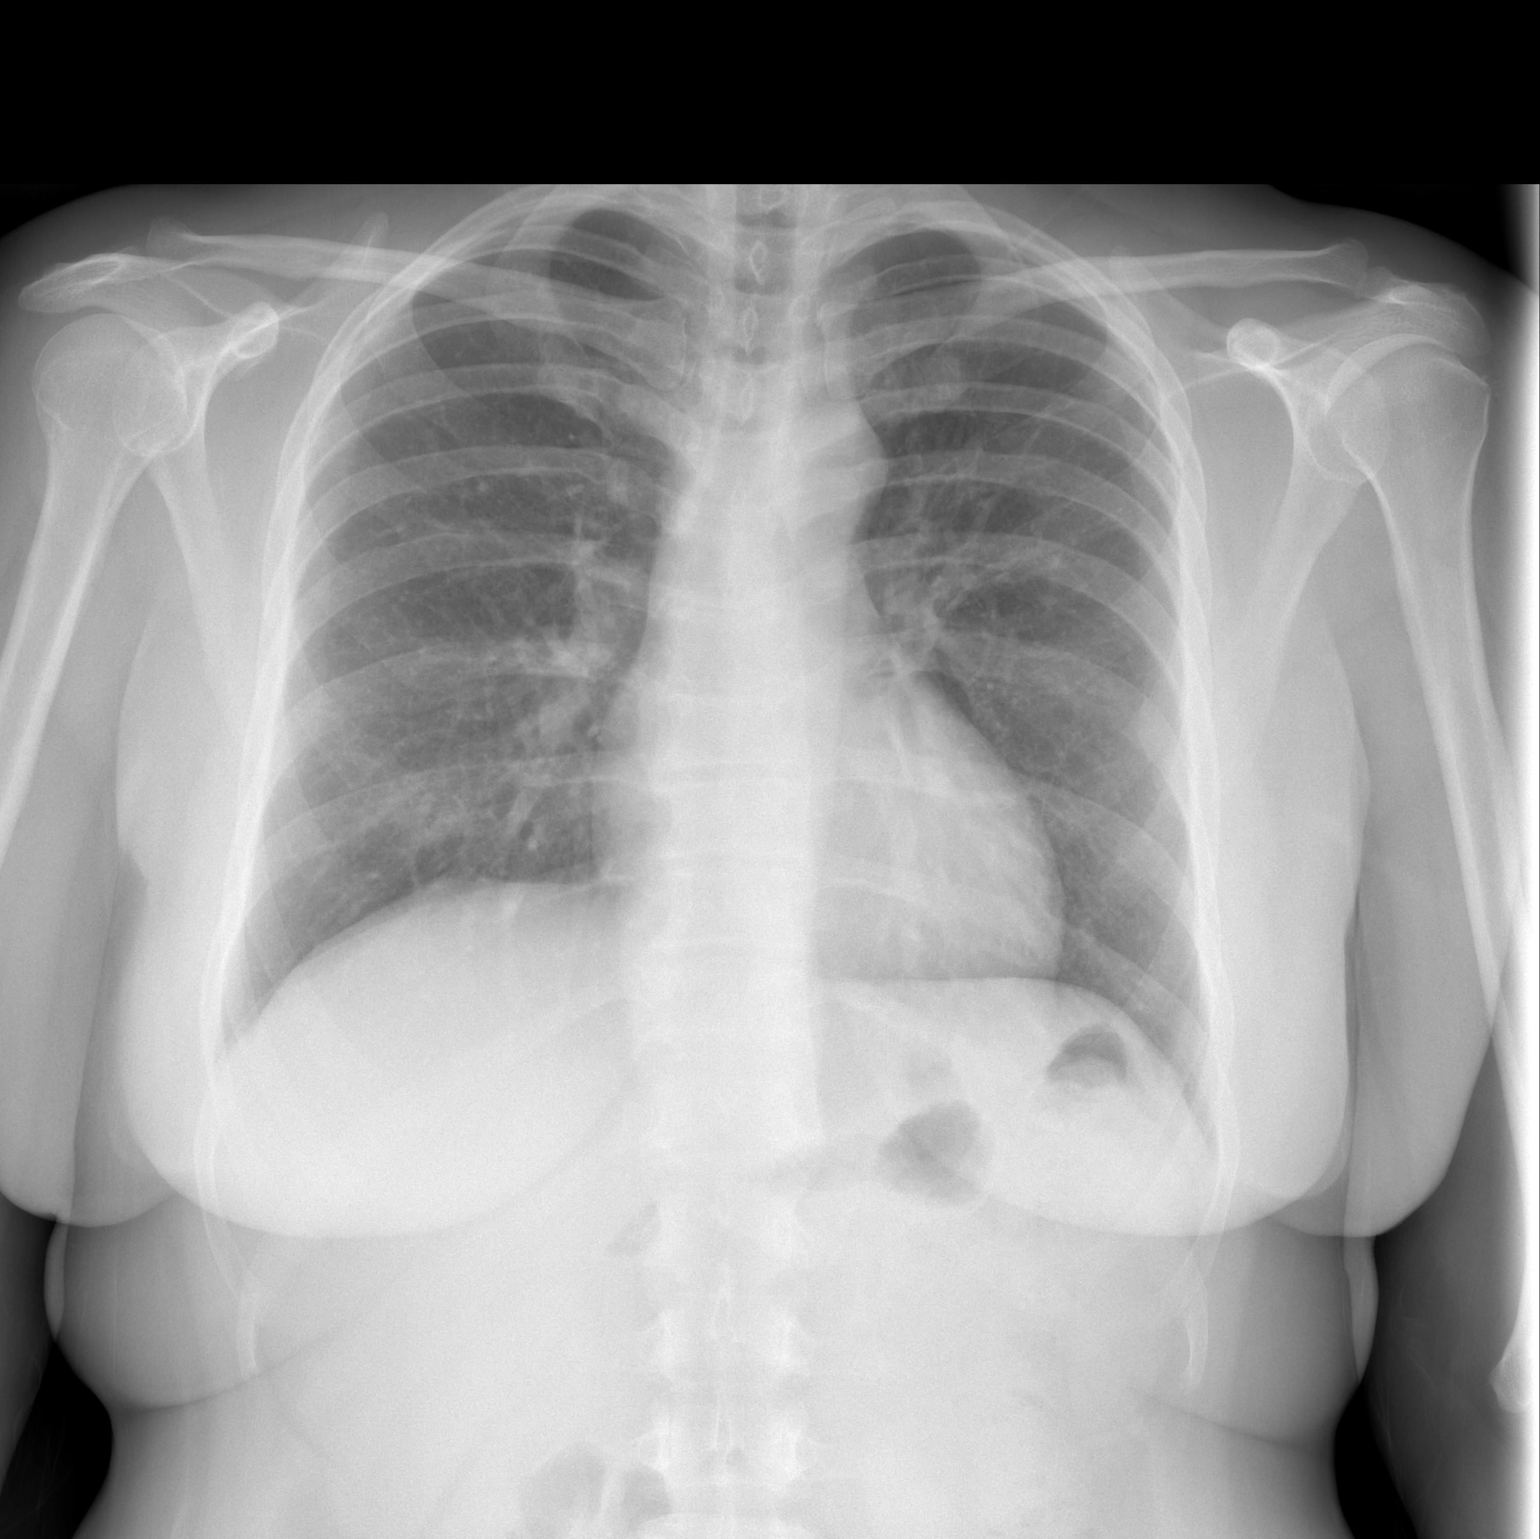

[w chest lat]
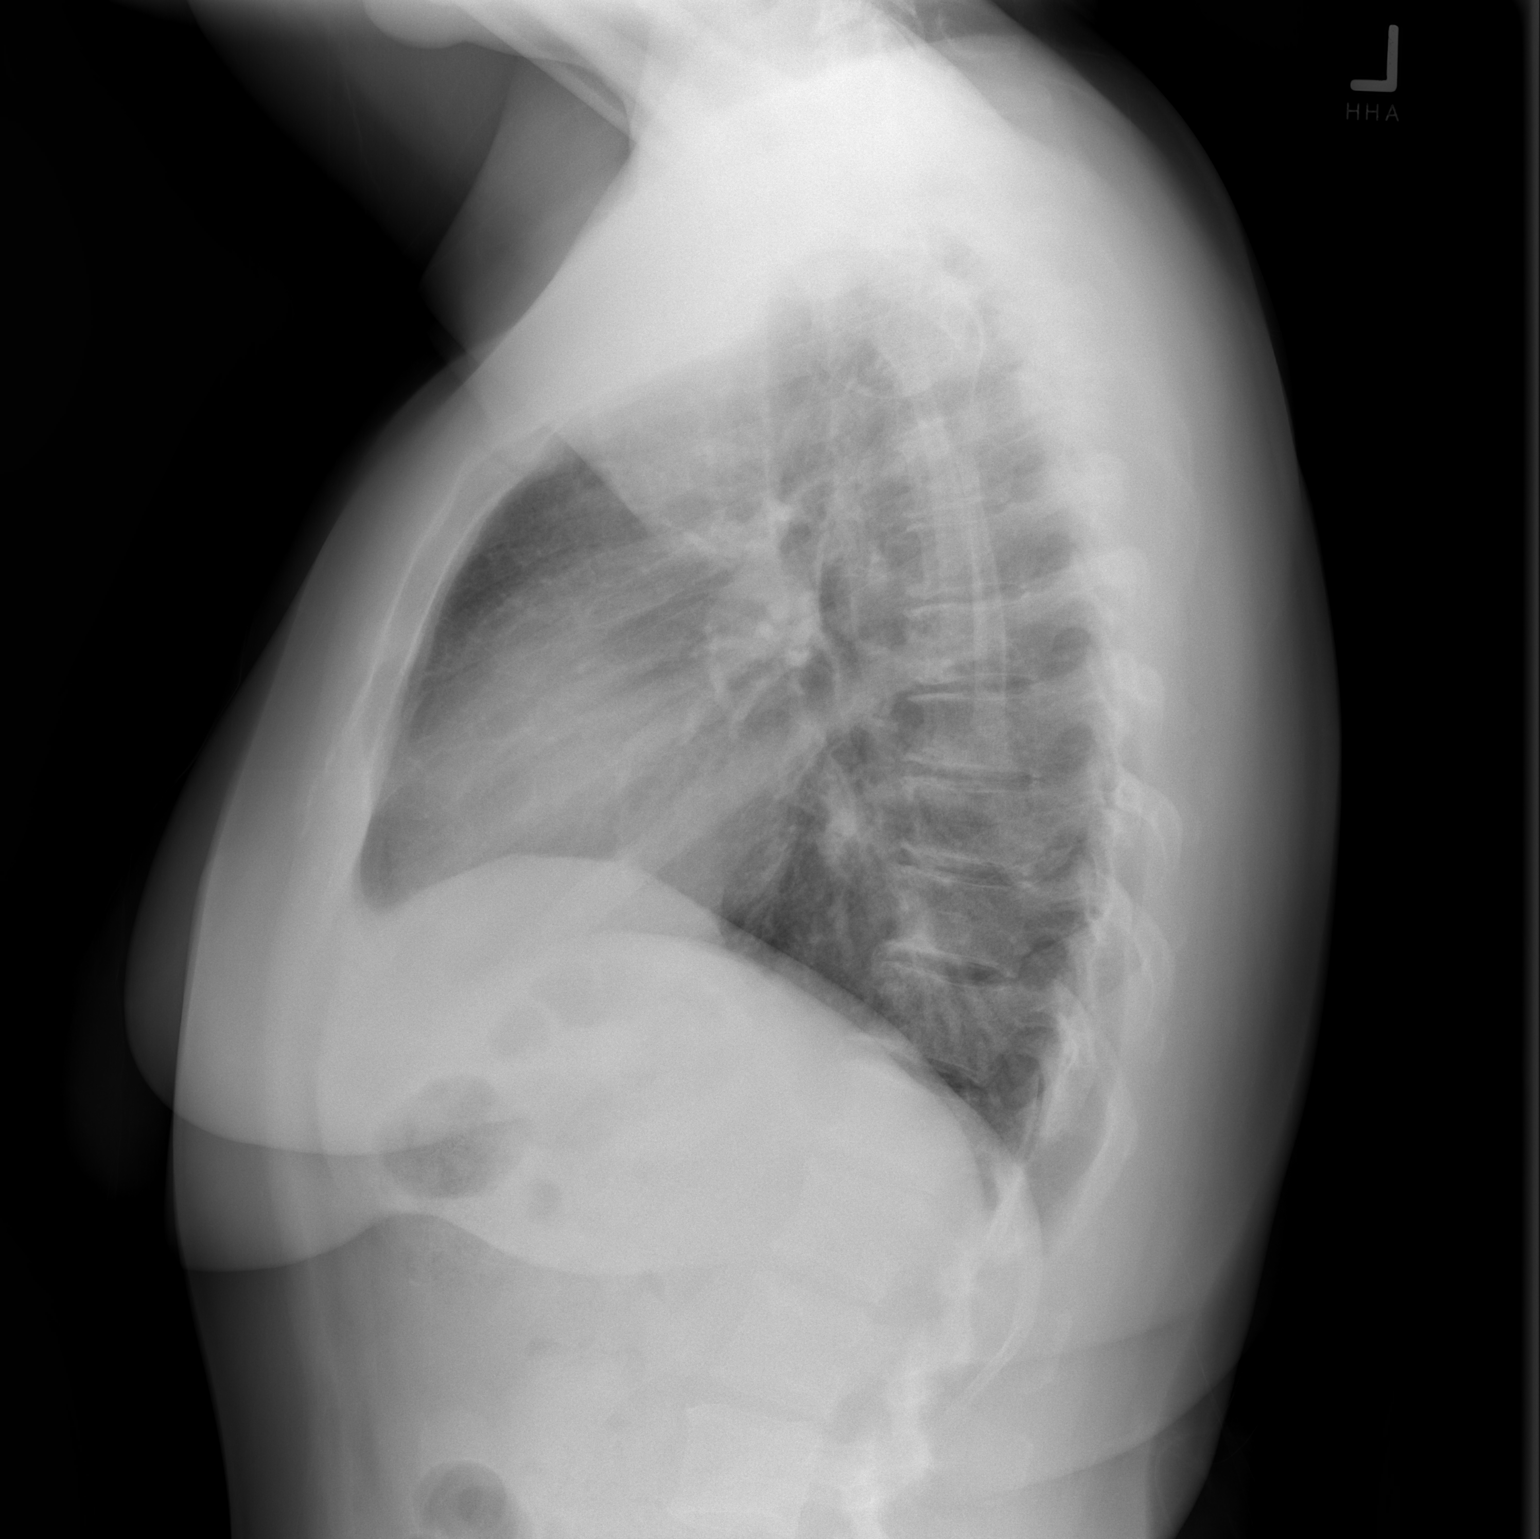

[2 of 2 positions shown; findings below may reference images not displayed]

FINDINGS: The lungs are adequately inflated and clear. The heart and pulmonary
vascularity are normal. The mediastinum is normal in width. There is
no pleural effusion. The bony thorax is unremarkable.
IMPRESSION: There is no active cardiopulmonary disease.

## 2018-03-31 IMAGING — RF DG UGI W/ KUB
11 of 15 series · 14 of 24 positions shown · non-contrast
Comparison: 01/28/2015

CLINICAL DATA: Preop assessment for gastric sleeve revision to
gastric bypass surgery.

EXAM:
UPPER GI SERIES WITH KUB
TECHNIQUE: After obtaining a scout radiograph a routine upper GI series was
performed using 01/28/2015
FLUOROSCOPY TIME:  Fluoroscopy Time:  2 minutes and 10 seconds
Radiation Exposure Index (if provided by the fluoroscopic device):
42.8 mGy
Number of Acquired Spot Images: 0

[Series 1: t abdomen supine · 0.15mm/px · 1 of 1 slices shown]
[im 1/1]
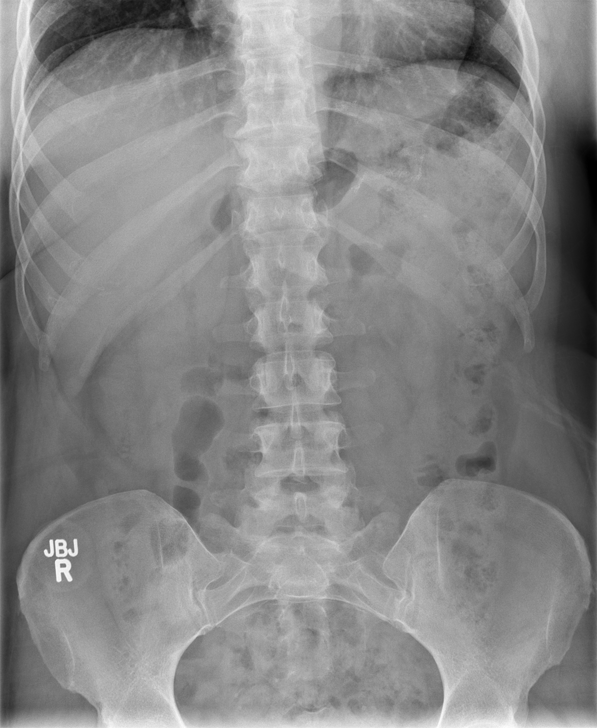

[Series 2: cp_standard · 0.53mm/px · 1 of 77 frames shown (1 of 10)]
[frame 39/77]
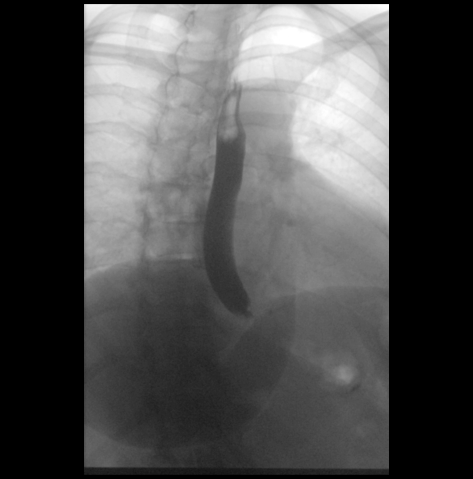

[Series 3: cp_standard · 0.35mm/px · 2 of 109 frames shown (2 of 10)]
[frame 29/109]
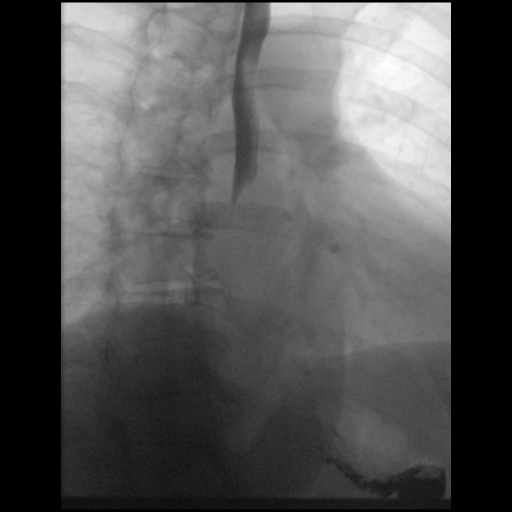
[frame 93/109]
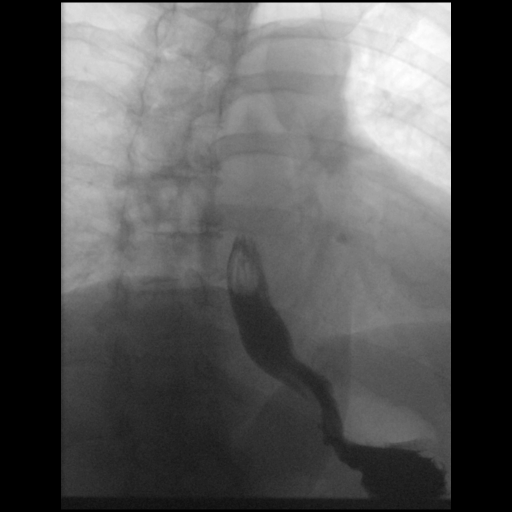

[Series 4: cp_standard · 0.35mm/px · 1 of 91 frames shown (3 of 10)]
[frame 46/91]
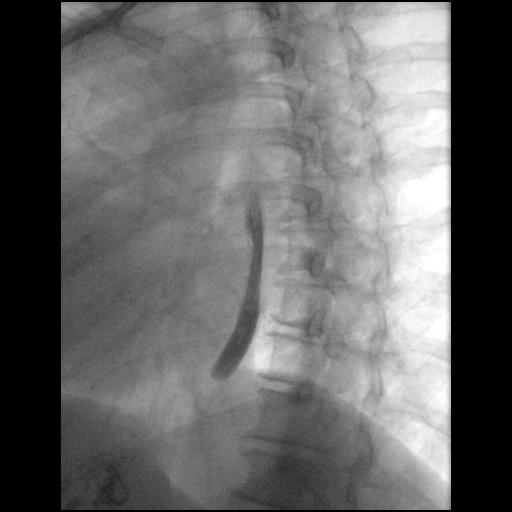

[Series 5: cp_standard · 0.35mm/px · 2 of 131 frames shown (4 of 10)]
[frame 5/131]
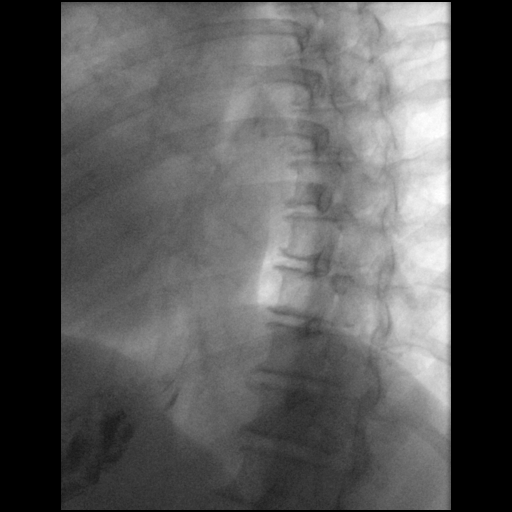
[frame 66/131]
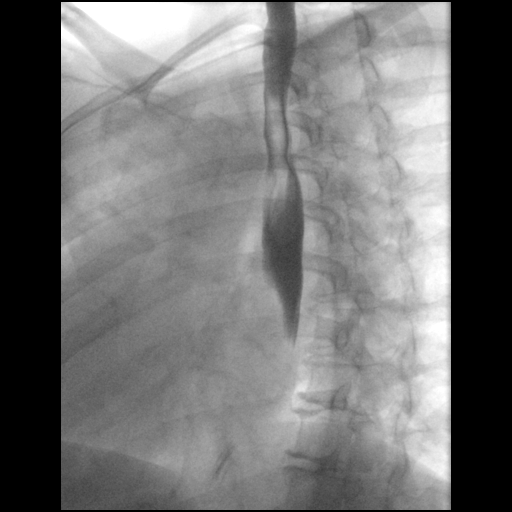

[Series 6: cp_standard · 0.17mm/px · 1 of 1 slices shown (5 of 10)]
[im 1/1]
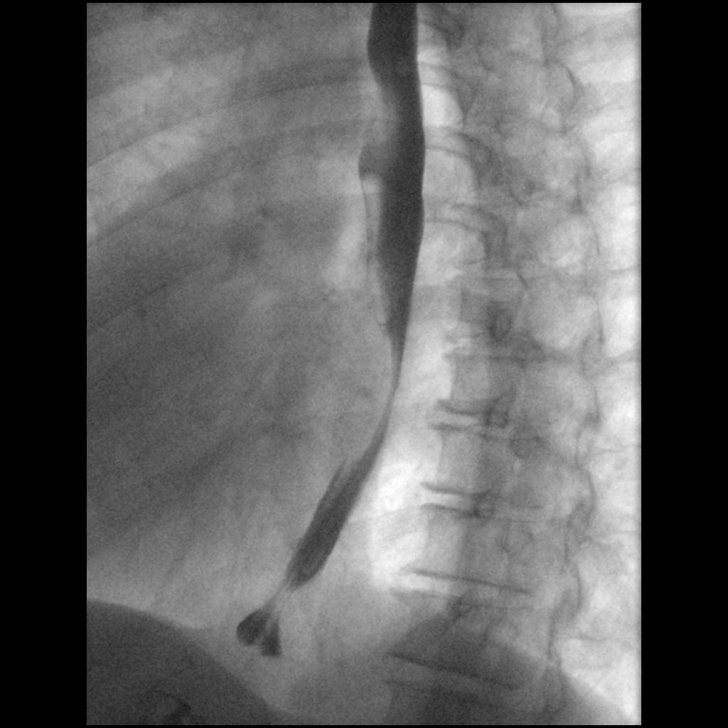

[Series 8: cp_standard · 0.17mm/px · 1 of 1 slices shown (6 of 10)]
[im 1/1]
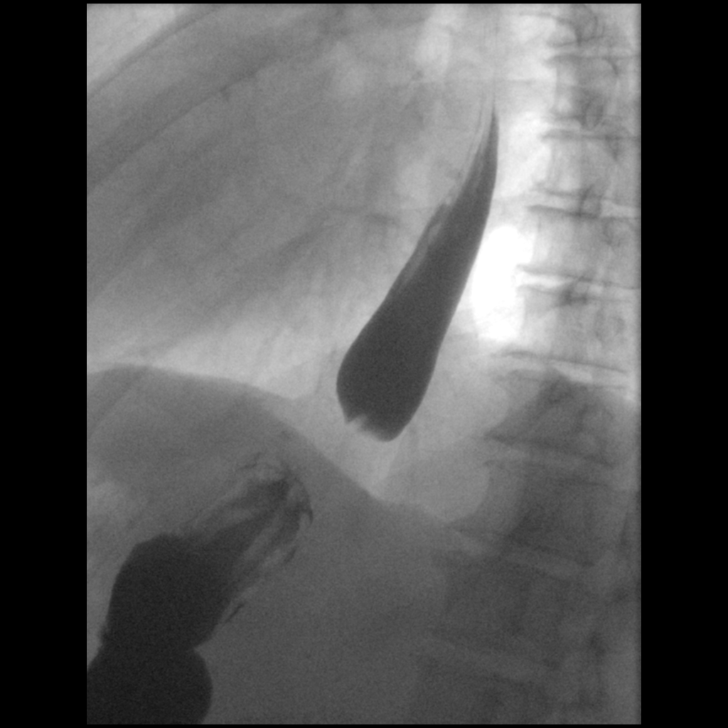

[Series 11: cp_standard · 0.17mm/px · 1 of 1 slices shown (7 of 10)]
[im 1/1]
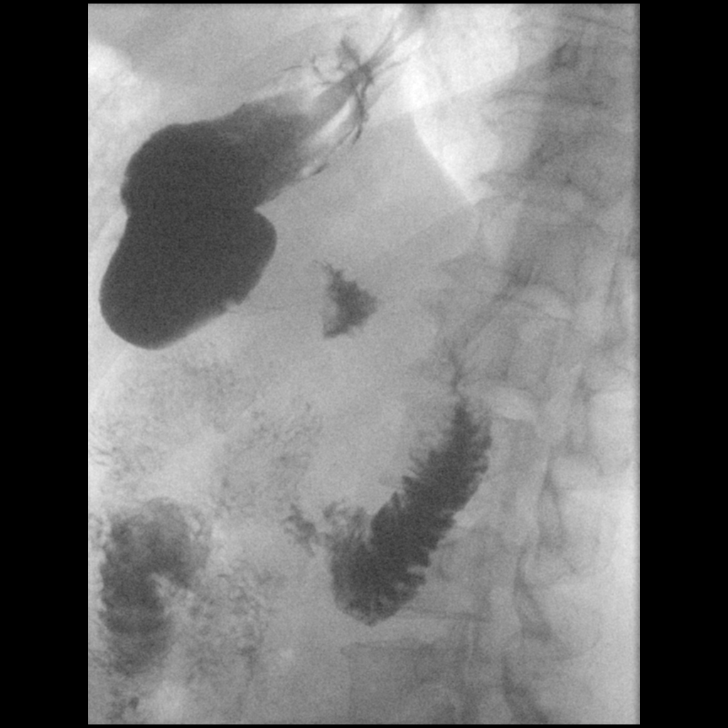

[Series 14: cp_standard · 0.35mm/px · 2 of 71 frames shown (8 of 10)]
[frame 1/71]
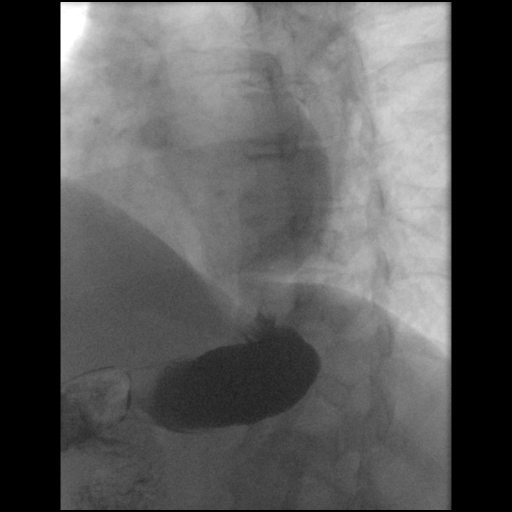
[frame 11/71]
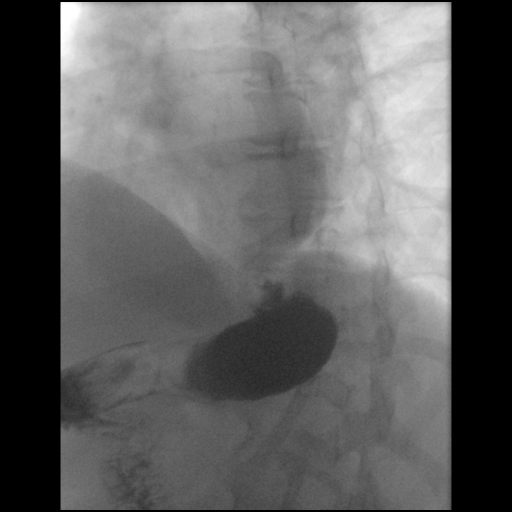

[Series 15: cp_standard · 0.17mm/px · 1 of 1 slices shown (9 of 10)]
[im 1/1]
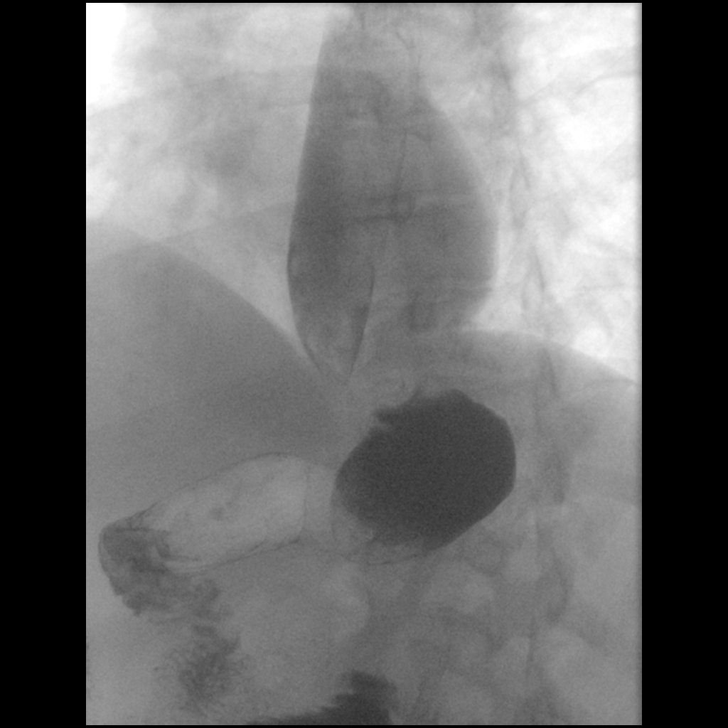

[Series 18: cp_standard · 0.17mm/px · 1 of 1 slices shown (10 of 10)]
[im 1/1]
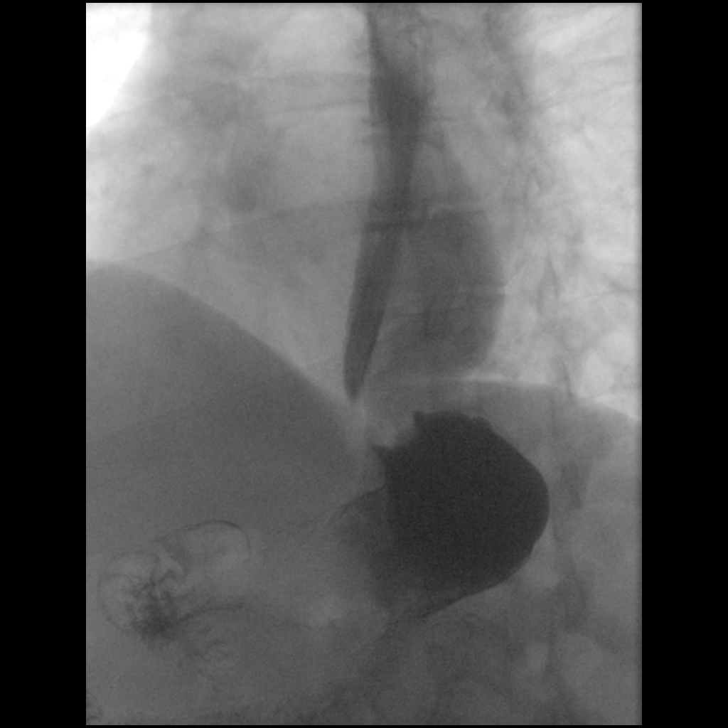

[14 of 24 positions shown; findings below may reference images not displayed]

FINDINGS: Initial barium swallows demonstrate normal esophageal motility. No
intrinsic or extrinsic lesions are identified. There is a small
sliding-type hiatal hernia and inducible GE reflux with water
swallowing.

Postoperative changes involving the stomach from prior gastric
sleeve procedure. No complicating features. The stomach appears
fairly capacious. The duodenum bulb and C-loop are normal. The
duodenum jejunal junction is in its normal anatomic location.
IMPRESSION: 1. Normal esophageal motility.
2. Small sliding-type hiatal hernia with inducible GE reflux.
3. Postoperative changes involving the stomach. No complicating
features.
4. Normal duodenum.

## 2020-03-05 ENCOUNTER — Ambulatory Visit: Payer: Self-pay | Admitting: General Surgery

## 2021-09-30 ENCOUNTER — Encounter (HOSPITAL_COMMUNITY): Payer: Self-pay | Admitting: *Deleted

## 2023-08-23 ENCOUNTER — Encounter (HOSPITAL_COMMUNITY): Payer: Self-pay

## 2023-08-23 ENCOUNTER — Other Ambulatory Visit: Payer: Self-pay

## 2023-08-23 ENCOUNTER — Emergency Department (HOSPITAL_COMMUNITY)
Admission: EM | Admit: 2023-08-23 | Discharge: 2023-08-24 | Disposition: A | Discharge status: Home / Self Care | Attending: Emergency Medicine | Admitting: Emergency Medicine

## 2023-08-23 ENCOUNTER — Emergency Department (HOSPITAL_COMMUNITY)

## 2023-08-23 DIAGNOSIS — R0602 Shortness of breath: Secondary | ICD-10-CM | POA: Diagnosis not present

## 2023-08-23 DIAGNOSIS — R079 Chest pain, unspecified: Secondary | ICD-10-CM | POA: Diagnosis present

## 2023-08-23 LAB — CBC
HCT: 42.3 % (ref 36.0–46.0)
Hemoglobin: 13.7 g/dL (ref 12.0–15.0)
MCH: 29.5 pg (ref 26.0–34.0)
MCHC: 32.4 g/dL (ref 30.0–36.0)
MCV: 91 fL (ref 80.0–100.0)
Platelets: 266 10*3/uL (ref 150–400)
RBC: 4.65 MIL/uL (ref 3.87–5.11)
RDW: 12.6 % (ref 11.5–15.5)
WBC: 8.3 10*3/uL (ref 4.0–10.5)
nRBC: 0 % (ref 0.0–0.2)

## 2023-08-23 LAB — BASIC METABOLIC PANEL WITH GFR
Anion gap: 11 (ref 5–15)
BUN: 15 mg/dL (ref 6–20)
CO2: 24 mmol/L (ref 22–32)
Calcium: 9.8 mg/dL (ref 8.9–10.3)
Chloride: 104 mmol/L (ref 98–111)
Creatinine, Ser: 0.71 mg/dL (ref 0.44–1.00)
GFR, Estimated: >60 mL/min (ref >60)
Glucose, Bld: 98 mg/dL (ref 70–99)
Potassium: 3.9 mmol/L (ref 3.5–5.1)
Sodium: 139 mmol/L (ref 135–145)

## 2023-08-23 LAB — TROPONIN I (HIGH SENSITIVITY)
Troponin I (High Sensitivity): 9 ng/L (ref <18)
Troponin I (High Sensitivity): 8 ng/L (ref <18)

## 2023-08-23 NOTE — ED Triage Notes (Signed)
 Pt arrived POV from urgent care c/o upper back pain that started on Monday but then moved to the right side of her chest today. Pt denies any SHOB, nausea or dizziness but does endorse feeling fatigued. Urgent care stated pt's EKG was abnormal.

## 2023-08-23 NOTE — ED Provider Triage Note (Signed)
 Emergency Medicine Provider Triage Evaluation Note  Kayla Cooley , a 53 y.o. female  was evaluated in triage.  Pt complains of chest pain and back pain x 1 week.  No shortness of breath, nausea or dizziness.  No extremity weakness or numbness.  Review of Systems  Positive:  Negative:   Physical Exam  BP (!) 168/99 (BP Location: Right Arm)   Pulse 71   Temp 97.9 F (36.6 C) (Oral)   Resp 18   Ht 5' (1.524 m)   Wt 83 kg   SpO2 99%   BMI 35.74 kg/m  Gen:   Awake, no distress   Resp:  Normal effort  MSK:   Moves extremities without difficulty  Other:  Tenderness to mid thoracic region  Medical Decision Making  Medically screening exam initiated at 8:51 PM.  Appropriate orders placed.  Kayla Cooley was informed that the remainder of the evaluation will be completed by another provider, this initial triage assessment does not replace that evaluation, and the importance of remaining in the ED until their evaluation is complete.     Felicie Horning, PA-C 08/23/23 [phone]

## 2023-08-24 ENCOUNTER — Emergency Department (HOSPITAL_COMMUNITY)

## 2023-08-24 MED ORDER — IOHEXOL 350 MG/ML SOLN
75.0000 mL | Freq: Once | INTRAVENOUS | Status: AC | PRN
  Administered 2023-08-24: 75 mL via INTRAVENOUS

## 2023-08-24 MED ORDER — CYCLOBENZAPRINE HCL 10 MG PO TABS
5.0000 mg | ORAL_TABLET | Freq: Once | ORAL | Status: AC
  Administered 2023-08-24: 5 mg via ORAL
  Filled 2023-08-24: qty 1

## 2023-08-24 MED ORDER — ACETAMINOPHEN 325 MG PO TABS
650.0000 mg | ORAL_TABLET | Freq: Once | ORAL | Status: AC
  Administered 2023-08-24: 650 mg via ORAL
  Filled 2023-08-24: qty 2

## 2023-08-24 NOTE — Discharge Instructions (Signed)
 Thank for this evaluate you today.  I have provided you with recommendation to follow-up with cardiology.  Your CT scan does not show any embolism..  Your troponin/cardiac enzymes are negative.

## 2023-08-24 NOTE — ED Provider Notes (Signed)
 [place] [facility] AT [facility] Provider Note   CSN: [id number] Arrival date & time: 08/23/23  1925     History  Chief Complaint  Patient presents with   Chest Pain    Kayla Cooley is a 53 y.o. female with a past medical history of GERD, laparoscopic sleeve gastrectomy (2014) presents emergency department for evaluation of chest pain and shortness of breath that started on Monday, 08/20/2023 4 days prior that started while watching TV.  She reports that pain was originally on left upper back then migrated to the middle today.  She states "I feel like I have a broken rib".  Endorses shortness of breath at rest. Chest pressure and back pain worsen with deep breath. Does not appear to be exertional.  Has been using OTC meds and heating pad without relief.  Denies recent travel, surgery, history of clot, unilateral pedal edema, blood thinners.  Today, reports that pain has radiated to midsternal chest so sought evaluation at urgent care initially.  She was sent to ED due to EKG changes, complaints of chest pain.   Chest Pain      Home Medications Prior to Admission medications   Medication Sig Start Date End Date Taking? Authorizing Provider  acetaminophen  (TYLENOL ) 500 MG tablet Take 1,000 mg by mouth every 6 (six) hours as needed for moderate pain or headache.    [provider]  calcium carbonate (TUMS EX) 750 MG chewable tablet Chew 1 tablet by mouth daily.    [provider]  cetirizine (ZYRTEC) 10 MG tablet Take 10 mg by mouth daily.     [provider]  Multiple Vitamin (MULTIVITAMIN WITH MINERALS) TABS tablet Take 1 tablet by mouth daily. (Either bariatric or prenatal)    [provider]  oxyCODONE  (ROXICODONE ) 5 MG/5ML solution Take 5-10 mLs (5-10 mg total) by mouth every 4 (four) hours as needed for moderate pain or severe pain. Patient not taking: Reported on 04/24/2017 03/07/17   Aldean Hummingbird, MD  pantoprazole   (PROTONIX ) 40 MG tablet Take 40 mg by mouth See admin instructions. Scheduled each morning & in the evening as needed for indigestion/heartburn.    [provider]  vitamin B-12 (CYANOCOBALAMIN) 500 MCG tablet Take 500 mcg by mouth 3 (three) times a week.     [provider]      Allergies    Aspirin and Cashew nut oil    Review of Systems   Review of Systems  Cardiovascular:  Positive for chest pain.    Physical Exam Updated Vital Signs BP (!) 170/96   Pulse 70   Temp 98.1 F (36.7 C)   Resp 11   Ht 5' (1.524 m)   Wt 83 kg   SpO2 96%   BMI 35.74 kg/m  Physical Exam Vitals and nursing note reviewed.  Constitutional:      General: She is not in acute distress.    Appearance: Normal appearance.  HENT:     Head: Normocephalic and atraumatic.  Eyes:     Conjunctiva/sclera: Conjunctivae normal.  Cardiovascular:     Rate and Rhythm: Normal rate.  Pulmonary:     Effort: Pulmonary effort is normal. No respiratory distress.     Breath sounds: Normal breath sounds.     Comments: Speaking in full complete sentences without difficulty.  Maintaining oxygen saturation without supplementation.  No signs of respiratory distress Chest:     Chest wall: Tenderness (midsternal) present.  Abdominal:     General:  Bowel sounds are normal. There is no distension.     Palpations: Abdomen is soft.     Tenderness: There is no abdominal tenderness. There is no guarding.  Musculoskeletal:       Arms:     Right lower leg: No edema.     Left lower leg: No edema.     Comments: No swelling or tenderness to BLE.  Celine Collard' sign negative x 2  Skin:    Capillary Refill: Capillary refill takes less than 2 seconds.     Coloration: Skin is not jaundiced or pale.  Neurological:     Mental Status: She is alert and oriented to person, place, and time. Mental status is at baseline.     ED Results / Procedures / Treatments   Labs (all labs ordered are listed, but only abnormal  results are displayed) Labs Reviewed  BASIC METABOLIC PANEL WITH GFR  CBC  TROPONIN I (HIGH SENSITIVITY)  TROPONIN I (HIGH SENSITIVITY)    EKG EKG Interpretation Date/Time:  Thursday Aug 23 2023 19:56:46 EDT Ventricular Rate:  75 PR Interval:  176 QRS Duration:  86 QT Interval:  386 QTC Calculation: 431 R Axis:   30  Text Interpretation: Normal sinus rhythm Possible Inferior infarct , age undetermined Anterolateral infarct , age undetermined Abnormal ECG When compared with ECG of 20-Sep-2016 10:25, PREVIOUS ECG IS PRESENT similar to previous Confirmed by Florentino Hurdle [phone]) on 08/23/2023 8:58:39 PM  Radiology CT Angio Chest PE W and/or Wo Contrast Result Date: 08/24/2023 CLINICAL DATA:  Chest pain. EXAM: CT ANGIOGRAPHY CHEST WITH CONTRAST TECHNIQUE: Multidetector CT imaging of the chest was performed using the standard protocol during bolus administration of intravenous contrast. Multiplanar CT image reconstructions and MIPs were obtained to evaluate the vascular anatomy. RADIATION DOSE REDUCTION: This exam was performed according to the departmental dose-optimization program which includes automated exposure control, adjustment of the mA and/or kV according to patient size and/or use of iterative reconstruction technique. CONTRAST:  75mL OMNIPAQUE  IOHEXOL  350 MG/ML SOLN COMPARISON:  January 27, 2007 FINDINGS: Cardiovascular: The thoracic aorta is unremarkable. Satisfactory opacification of the pulmonary arteries to the segmental level. No evidence of pulmonary embolism. Normal heart size. No pericardial effusion. Mediastinum/Nodes: No enlarged mediastinal, hilar, or axillary lymph nodes. Thyroid gland, trachea, and esophagus demonstrate no significant findings. Lungs/Pleura: Mild lingular and mild posteromedial right basilar scarring and/or atelectasis is seen. There is no evidence of an acute infiltrate, pleural effusion or pneumothorax. Upper Abdomen: Surgical sutures are seen throughout  the gastric region. Surgically anastomosed bowel is also seen within the mid left abdomen. Musculoskeletal: No chest wall abnormality. No acute or significant osseous findings. Review of the MIP images confirms the above findings. IMPRESSION: 1. No evidence of pulmonary embolism or other acute intrathoracic process. 2. Mild lingular and mild posteromedial right basilar scarring and/or atelectasis. 3. Findings consistent with history of prior gastric surgery. Electronically Signed   By: Virgle Grime M.D.   On: 08/24/2023 02:47   DG Chest 2 View Result Date: 08/23/2023 CLINICAL DATA:  Chest pain EXAM: CHEST - 2 VIEW COMPARISON:  09/20/2016 FINDINGS: The heart size and mediastinal contours are within normal limits. Both lungs are clear. The visualized skeletal structures are unremarkable. IMPRESSION: No active cardiopulmonary disease. Electronically Signed   By: Esmeralda Hedge M.D.   On: 08/23/2023 21:03    Procedures Procedures    Medications Ordered in ED Medications  iohexol  (OMNIPAQUE ) 350 MG/ML injection 75 mL (75 mLs Intravenous Contrast Given 08/24/23 0242)  acetaminophen  (TYLENOL ) tablet 650 mg (650 mg Oral Given 08/24/23 0320)  cyclobenzaprine  (FLEXERIL ) tablet 5 mg (5 mg Oral Given 08/24/23 0320)    ED Course/ Medical Decision Making/ A&P                                 Medical Decision Making Amount and/or Complexity of Data Reviewed Labs: ordered. Radiology: ordered.  Risk OTC drugs. Prescription drug management.   Patient presents to the ED for concern of chest pain, shortness of breath, this involves an extensive number of treatment options, and is a complaint that carries with it a high risk of complications and morbidity.  The differential diagnosis includes ACS, wheezing, fluid overload, pneumonia, MSK, dissection, PE   Co morbidities that complicate the patient evaluation  None   Additional history obtained:  Additional history obtained from  Nursing    External records from outside source obtained and reviewed including triage RN note   Lab Tests:  I Ordered, and personally interpreted labs.  The pertinent results include:   Troponin negative x 2   Imaging Studies ordered:  I ordered imaging studies including chest x-ray, CT PE I independently visualized and interpreted imaging which showed no acute cardiopulmonary pathology I agree with the radiologist interpretation   Cardiac Monitoring:  The patient was maintained on a cardiac monitor.  I personally viewed and interpreted the cardiac monitored which showed an underlying rhythm of: NSR with inverted T waves in lead III that are new   Medicines ordered and prescription drug management:  I ordered medication including tylenol  and flexeril   for pain, muscle strain  Reevaluation of the patient after these medicines showed that the patient improved I have reviewed the patients home medicines and have made adjustments as needed    Problem List / ED Course:  CP Midsternal chest ttp Troponin negative x 2 EKG does show inverted T waves in lead III and mildly worsening Q waves. Will provide outpatient cardiology follow-up  Back pain Reproducible. Worsened with palpation and specific movements of back. Will provide Tylenol  and Flexeril  for possible muscle strain Offered prescription however patient refuses and can take Tylenol  at home  [facility] No RF for PE other than age so cannot PERC out. Also has q wave and t wave inversion in lead III that are new so obtained CTPE to r/o PE Fortunately this is negative Does not appear to be in respiratory distress.  Maintaining oxygen saturation without supplementation.  Lung sounds CTAB Low suspicion for fluid overload with no pedal edema, clear lung sounds   Reevaluation:  After the interventions noted above, I reevaluated the patient and found that they have :improved    Dispostion:  After consideration of the diagnostic  results and the patients response to treatment, I feel that the patent would benefit from outpatient management with cardiology follow-up.   Discussed ED workup, disposition, return to ED precautions with patient who expresses understanding agrees with plan.  All questions answered to their satisfaction.  They are agreeable to plan.  Discharge instructions provided on paperwork Final Clinical Impression(s) / ED Diagnoses Final diagnoses:  Chest pain, unspecified type  Shortness of breath    Rx / DC Orders ED Discharge Orders          Ordered    Ambulatory referral to Cardiology       Comments: If you have not heard from the Cardiology office within the next 72  hours please call [phone].   08/24/23 0343              Royann Cords, PA 08/24/23 2440    Edson Graces, MD 08/28/23 [phone]

## 2023-09-07 ENCOUNTER — Ambulatory Visit

## 2023-09-07 VITALS — BP 130/100 | HR 63 | Ht 60.0 in | Wt 179.0 lb

## 2023-09-07 DIAGNOSIS — R079 Chest pain, unspecified: Secondary | ICD-10-CM | POA: Insufficient documentation

## 2023-09-07 DIAGNOSIS — I10 Essential (primary) hypertension: Secondary | ICD-10-CM | POA: Diagnosis not present

## 2023-09-07 MED ORDER — CARVEDILOL 3.125 MG PO TABS: 3.1250 mg | ORAL_TABLET | Freq: Two times a day (BID) | ORAL | 3 refills | Status: AC

## 2023-09-07 NOTE — Patient Instructions (Signed)
 Medication Instructions:  Your physician recommends that you continue on your current medications as directed. Please refer to the Current Medication list given to you today.  *If you need a refill on your cardiac medications before your next appointment, please call your pharmacy*   Lab Work: None ordered If you have labs (blood work) drawn today and your tests are completely normal, you will receive your results only by: MyChart Message (if you have MyChart) OR A paper copy in the mail If you have any lab test that is abnormal or we need to change your treatment, we will call you to review the results.   Testing/Procedures: Your physician has requested that you have an echocardiogram. Echocardiography is a painless test that uses sound waves to create images of your heart. It provides your doctor with information about the size and shape of your heart and how well your heart's chambers and valves are working. This procedure takes approximately one hour. There are no restrictions for this procedure. Please do NOT wear cologne, perfume, aftershave, or lotions (deodorant is allowed). Please arrive 15 minutes prior to your appointment time.  Please note: We ask at that you not bring children with you during ultrasound (echo/ vascular) testing. Due to room size and safety concerns, children are not allowed in the ultrasound rooms during exams. Our front office staff cannot provide observation of children in our lobby area while testing is being conducted. An adult accompanying a patient to their appointment will only be allowed in the ultrasound room at the discretion of the ultrasound technician under special circumstances. We apologize for any inconvenience.    [facility] at   September 07, 2023       Please arrive 15 minutes prior to your appointment time for registration and insurance purposes.  The test will take approximately 45 minutes to complete.  How to  prepare for your Exercise Stress Test: Do bring a list of your current medications with you.  If not listed below, you may take your medications as normal. Do not take carvedilol (Coreg) for 24 hours prior to the test.  Bring the medication to your appointment as you may be required to take it once the test is complete. Do wear comfortable clothes (no dresses or overalls) and walking shoes, tennis shoes preferred (no heels or open toed shoes are allowed) Do Not wear cologne, perfume, aftershave or lotions (deodorant is allowed). Please report to [address] [facility], [address] for your test.  If these instructions are not followed, your test will have to be rescheduled.  If you have questions or concerns about your appointment, you can call the Stress Lab at [phone].  If you cannot keep your appointment, please provide 24 hours notification to the Stress Lab, to avoid a possible $50 charge to your account.  Follow-Up: At [facility], you and your health needs are our priority.  As part of our continuing mission to provide you with exceptional heart care, we have created designated Provider Care Teams.  These Care Teams include your primary Cardiologist (physician) and Advanced Practice Providers (APPs -  Physician Assistants and Nurse Practitioners) who all work together to provide you with the care you need, when you need it.  We recommend signing up for the patient portal called MyChart.  Sign up information is provided on this After Visit Summary.  MyChart is used to connect with patients for Virtual Visits (Telemedicine).  Patients are able to view lab/test results, encounter notes,  upcoming appointments, etc.  Non-urgent messages can be sent to your provider as well.   To learn more about what you can do with MyChart, go to [email or web address].    Your next appointment:   Follow up based on results   The format for your next appointment:   In Person  Provider:   Tereasa Felty  Madireddy, MD   Other Instructions Echocardiogram An echocardiogram is a test that uses sound waves (ultrasound) to produce images of the heart. Images from an echocardiogram can provide important information about: Heart size and shape. The size and thickness and movement of your heart's walls. Heart muscle function and strength. Heart valve function or if you have stenosis. Stenosis is when the heart valves are too narrow. If blood is flowing backward through the heart valves (regurgitation). A tumor or infectious growth around the heart valves. Areas of heart muscle that are not working well because of poor blood flow or injury from a heart attack. Aneurysm detection. An aneurysm is a weak or damaged part of an artery wall. The wall bulges out from the normal force of blood pumping through the body. Tell a health care provider about: Any allergies you have. All medicines you are taking, including vitamins, herbs, eye drops, creams, and over-the-counter medicines. Any blood disorders you have. Any surgeries you have had. Any medical conditions you have. Whether you are pregnant or may be pregnant. What are the risks? Generally, this is a safe test. However, problems may occur, including an allergic reaction to dye (contrast) that may be used during the test. What happens before the test? No specific preparation is needed. You may eat and drink normally. What happens during the test? You will take off your clothes from the waist up and put on a hospital gown. Electrodes or electrocardiogram (ECG)patches may be placed on your chest. The electrodes or patches are then connected to a device that monitors your heart rate and rhythm. You will lie down on a table for an ultrasound exam. A gel will be applied to your chest to help sound waves pass through your skin. A handheld device, called a transducer, will be pressed against your chest and moved over your heart. The transducer produces  sound waves that travel to your heart and bounce back (or echo back) to the transducer. These sound waves will be captured in real-time and changed into images of your heart that can be viewed on a video monitor. The images will be recorded on a computer and reviewed by your health care provider. You may be asked to change positions or hold your breath for a short time. This makes it easier to get different views or better views of your heart. In some cases, you may receive contrast through an IV in one of your veins. This can improve the quality of the pictures from your heart. The procedure may vary among health care providers and hospitals.   What can I expect after the test? You may return to your normal, everyday life, including diet, activities, and medicines, unless your health care provider tells you not to do that. Follow these instructions at home: It is up to you to get the results of your test. Ask your health care provider, or the department that is doing the test, when your results will be ready. Keep all follow-up visits. This is important. Summary An echocardiogram is a test that uses sound waves (ultrasound) to produce images of the heart. Images from an echocardiogram can provide  important information about the size and shape of your heart, heart muscle function, heart valve function, and other possible heart problems. You do not need to do anything to prepare before this test. You may eat and drink normally. After the echocardiogram is completed, you may return to your normal, everyday life, unless your health care provider tells you not to do that. This information is not intended to replace advice given to you by your health care provider. Make sure you discuss any questions you have with your health care provider. Document Revised: 11/04/2019 Document Reviewed: 11/04/2019 Elsevier Patient Education  2021 [employer]   Important Information About Sugar

## 2023-09-07 NOTE — Progress Notes (Signed)
 Cardiology Consultation:    Date:  09/07/2023   ID:  Kayla Cooley, DOB [birth year], MRN [id number]  PCP:  Kayla Sabins., MD  Cardiologist:  Kayla Evans Quashawn Jewkes, MD   Referring MD: Kayla Cords, PA   Chief Complaint  Patient presents with   Follow-up     ASSESSMENT AND PLAN:   Kayla Cooley 53 year old woman with history of GERD, gastrectomy 2014 for severe reflux symptoms, history of hysterectomy 2009, hypertension, obesity, occasional use of marijuana Gummies. No significant prior cardiac health concerns, no prior stress test.  She reports a remote history of echocardiogram done prior to her gastrectomy, I do not have those test results to review.  Evaluated for atypical known cardiac sounding chest pain appears more pleuritic in description.  Problem List Items Addressed This Visit     Chest pain of uncertain etiology - Primary   Pleuritic sounding chest pain lasted for over 10 days gradually subsided. Differential diagnosis includes pericarditis, pleuritis, musculoskeletal pain related to chest wall syndrome.  Negative troponins, nonspecific EKG changes with T wave inversions in lead III and nonspecific changes in aVF. CTA excluded pulmonary embolism at the ER visit.  Low suspicion for this to have been coronary related symptoms.  She has been symptom-free and do not expect any significant inflammatory markers at this time and hence we will hold off on further blood work testing.  Repeat EKG today in the office less prominent T wave changes in lead III, aVF appears normal.  She has been avoiding physical exertion for fear of cardiac causes.  Will obtain transthoracic echocardiogram to rule out any significant cardiac structural and functional abnormalities and any significant features suggestive of pericardial disease.  Will obtain a formal treadmill EKG stress test to assess objectively her functional capacity and to rule out any significant arrhythmia  concerns with exertion before she can embark on a regular exercise regimen.      Relevant Medications   carvedilol (COREG) 3.125 MG tablet   Other Relevant Orders   ECHOCARDIOGRAM COMPLETE   Exercise Tolerance Test   EKG 12-Lead (Completed)   Hypertension   Blood pressures remain elevated suboptimal. Target blood pressure below 130/80 mmHg. Continue amlodipine 5 mg once daily. Transition from metoprolol  to carvedilol 3.125 mg twice daily.       Relevant Medications   amLODipine (NORVASC) 5 MG tablet   carvedilol (COREG) 3.125 MG tablet    Return to clinic for follow-up with us  based on test results.   History of Present Illness:    Kayla Cooley is a 53 y.o. female who is being seen today for the evaluation of chest pain.  At the request of Kayla Cords, PA.  Pleasant woman here for the visit by herself.  Lives with her husband at home.  Does work Immunologist.  Currently she is taking a break until her current concerns are taking care of.   Has history of GERD, gastrectomy 2014 for severe reflux symptoms, history of hysterectomy 2009, hypertension, obesity, occasional use of marijuana Gummies. No significant prior cardiac health concerns, no prior stress test.  She reports a remote history of echocardiogram done prior to her gastrectomy, I do not have those test results to review.  On Aug 23, 2023 she was at emergency room for symptoms of chest pain associated with shortness of breath that were present since May 26.  In the ER was noted to be hypertensive with blood pressure 170/96 mmHg, there was chest wall tenderness noted  on physical examination, high-sensitivity troponins x 2 was unremarkable, EKG showed nonspecific changes with T wave inversions in lead III, imaging with CTA chest done ruled out any evidence of pulmonary embolism.  Thoracic aorta was unremarkable.  Heart size was noted to be normal.  No pericardial effusion.  CBC and BMP was  unremarkable.  Mentions that the symptoms started around May 26 while at rest describes it as a sensation of weight discomfort in the upper back radiating to the chest, present constant, worse with deep breaths. No significant fever or chills that she could remember.  Symptoms progressed with more intense symptoms and retrosternal and sensation prompting visit to the ER.  After her ER visit and subsequent discharge her symptoms gradually relieved over the following 4 to 5 days.  For the past week she has been symptom-free.  She has avoided any significant physical exertion pending further evaluation with urgent cardiology. She has lost 10 to 11 pounds intentionally over the last few months after having gained about the same weight in the past year.  Mentions longstanding history of hypertension for which she was on metoprolol  XL 25 mg once daily for years. Recently amlodipine was started 6 months ago 5 mg once daily. Blood pressures in the ER were elevated. At home as she measures blood pressures are ranging systolic 130s to 161W and diastolic 80s to 90s.  Denies any significant snoring or sleep apnea related concerns.   Does not smoke. Does drink alcohol 2-3 times a week, mentions her and her husband a biker send do frequent happy hour to the bars. Consumes marijuana Gummies occasionally to help with sleep but has not been using it in many months.  Reports family history of heart disease in her father at a young age.  EKG in the clinic today shows sinus rhythm heart rate 60/min, no significant ST-T changes to suggest ischemia.  Nonspecific T wave changes in lead III.  Past Medical History:  Diagnosis Date   Allergy    Arthritis    Gastroesophageal reflux disease    Headache(784.0)    migraines but none since hysterectomy   Hypertension    HX OF, PRIOR TO SLEEVE GASTRECTOMY OFF SINCE 2015   PONV (postoperative nausea and vomiting)     Past Surgical History:  Procedure Laterality  Date   17 HOUR PH STUDY N/A 06/26/2016   Procedure: 24 HOUR PH STUDY;  Surgeon: Sergio Dandy, MD;  Location: [facility];  Service: Endoscopy;  Laterality: N/A;   ARTHROSCOPIC REPAIR ACL Right    BREAST REDUCTION SURGERY  1992   CARPAL TUNNEL RELEASE  2001   right hand   ESOPHAGEAL MANOMETRY N/A 06/26/2016   Procedure: ESOPHAGEAL MANOMETRY (EM);  Surgeon: Sergio Dandy, MD;  Location: [facility];  Service: Endoscopy;  Laterality: N/A;   ESOPHAGOGASTRODUODENOSCOPY (EGD) WITH PROPOFOL  N/A 05/01/2016   Procedure: ESOPHAGOGASTRODUODENOSCOPY (EGD) WITH PROPOFOL  POSSIBLE BIOPSY;  Surgeon: Aldean Hummingbird, MD;  Location: [facility];  Service: General;  Laterality: N/A;   GASTRIC ROUX-EN-Y N/A 03/05/2017   Procedure: Conversion to LAPAROSCOPIC ROUX-EN-Y GASTRIC BYPASS WITH UPPER ENDOSCOPY,  Hiatal Hernia Repair;  Surgeon: Aldean Hummingbird, MD;  Location: [facility];  Service: General;  Laterality: N/A;   KNEE ARTHROSCOPY Left    x 2   KNEE ARTHROSCOPY W/ ACL RECONSTRUCTION Left    LAPAROSCOPIC GASTRIC SLEEVE RESECTION N/A 01/13/2013   Procedure: LAPAROSCOPIC GASTRIC SLEEVE RESECTION;  Surgeon: Evander Hills, DO;  Location: [facility];  Service: General;  Laterality: N/A;  PARTIAL HYSTERECTOMY  2009   REPLACEMENT TOTAL KNEE Left    skin removal surgery     TUBAL LIGATION     UPPER GI ENDOSCOPY  01/13/2013   Procedure: UPPER GI ENDOSCOPY;  Surgeon: Evander Hills, DO;  Location: [facility];  Service: General;;   uterine ablation      Current Medications: Current Meds  Medication Sig   acetaminophen  (TYLENOL ) 500 MG tablet Take 1,000 mg by mouth every 6 (six) hours as needed for moderate pain or headache.   amLODipine (NORVASC) 5 MG tablet Take 5 mg by mouth daily.   Biotin 5000 MCG CAPS Take 5,000 mcg by mouth daily.   calcium carbonate (TUMS EX) 750 MG chewable tablet Chew 1 tablet by mouth daily.   carvedilol (COREG) 3.125 MG tablet Take 1 tablet (3.125 mg total) by mouth 2 (two) times daily.    cetirizine (ZYRTEC) 10 MG tablet Take 10 mg by mouth daily.    Fexofenadine HCl (ALLERGY 24-HR PO) Take 1 tablet by mouth daily.   Multiple Vitamin (MULTIVITAMIN WITH MINERALS) TABS tablet Take 1 tablet by mouth daily. (Either bariatric or prenatal)   Prenatal Vit-Fe Fumarate-FA (MULTIVITAMIN-PRENATAL) 27-0.8 MG TABS tablet Take 1 tablet by mouth daily at 12 noon.   Probiotic Product (PROBIOTIC DAILY PO) Take 1 capsule by mouth daily.   [DISCONTINUED] metoprolol  succinate (TOPROL -XL) 25 MG 24 hr tablet Take 25 mg by mouth daily.     Allergies:   Aspirin and Cashew nut oil   Social History   Socioeconomic History   Marital status: Married    Spouse name: Not on file   Number of children: Not on file   Years of education: Not on file   Highest education level: Not on file  Occupational History   Not on file  Tobacco Use   Smoking status: Never   Smokeless tobacco: Never  Substance and Sexual Activity   Alcohol use: Yes    Comment: socially   Drug use: No   Sexual activity: Yes  Other Topics Concern   Not on file  Social History Narrative   Not on file   Social Drivers of Health   Financial Resource Strain: Low Risk  (10/01/2019)   Received from [facility] visits prior to 05/27/2022.   Overall Financial Resource Strain (CARDIA)    Difficulty of Paying Living Expenses: Not very hard  Food Insecurity: No Food Insecurity (10/01/2019)   Received from [facility] visits prior to 05/27/2022.   Hunger Vital Sign    Worried About Running Out of Food in the Last Year: Never true    Ran Out of Food in the Last Year: Never true  Transportation Needs: No Transportation Needs (10/01/2019)   Received from [facility] visits prior to 05/27/2022.   PRAPARE - Administrator, Civil Service (Medical): No    Lack of Transportation (Non-Medical): No  Physical Activity: Sufficiently Active (10/01/2019)   Received from [facility] visits prior to 05/27/2022.   Exercise Vital Sign    Days of Exercise per Week: 6 days    Minutes of Exercise per Session: 40 min  Stress: No Stress Concern Present (10/01/2019)   Received from [facility] visits prior to 05/27/2022.   [employer] of Occupational Health - Occupational Stress Questionnaire    Feeling of Stress : Only a little  Social Connections: Moderately Integrated (10/01/2019)   Received  from [facility] visits prior to 05/27/2022.   Social Connection and Isolation Panel    Frequency of Communication with Friends and Family: Twice a week    Frequency of Social Gatherings with Friends and Family: Three times a week    Attends Religious Services: Never    Active Member of Clubs or Organizations: Yes    Attends Banker Meetings: 1 to 4 times per year    Marital Status: Married     Family History: The patient's family history includes Breast cancer in her paternal aunt; Diabetes in her maternal grandmother; Heart disease in her father; Prostate cancer in her maternal grandfather. There is no history of Colon cancer. ROS:   Please see the history of present illness.    All 14 point review of systems negative except as described per history of present illness.  EKGs/Labs/Other Studies Reviewed:    The following studies were reviewed today:   EKG:       Recent Labs: 08/23/2023: BUN 15; Creatinine, Ser 0.71; Hemoglobin 13.7; Platelets 266; Potassium 3.9; Sodium 139  Recent Lipid Panel    Component Value Date/Time   CHOL 162 06/05/2013 0715   TRIG 81 06/05/2013 0715   HDL 50 06/05/2013 0715   CHOLHDL 3.2 06/05/2013 0715   VLDL 16 06/05/2013 0715   LDLCALC 96 06/05/2013 0715    Physical Exam:    VS:  BP (!) 130/100   Pulse 63   Ht 5' (1.524 m)   Wt 179 lb (81.2 kg)   SpO2 99%   BMI 34.96 kg/m     Wt Readings from Last 3 Encounters:  09/07/23 179 lb (81.2 kg)  08/23/23 183  lb (83 kg)  04/24/17 162 lb 3.2 oz (73.6 kg)     GENERAL:  Well nourished, well developed in no acute distress NECK: No JVD; No carotid bruits CARDIAC: RRR, S1 and S2 present, no murmurs, no rubs, no gallops CHEST:  Clear to auscultation without rales, wheezing or rhonchi  Extremities: No pitting pedal edema. Pulses bilaterally symmetric with radial 2+ and dorsalis pedis 2+ NEUROLOGIC:  Alert and oriented x 3  Medication Adjustments/Labs and Tests Ordered: Current medicines are reviewed at length with the patient today.  Concerns regarding medicines are outlined above.  Orders Placed This Encounter  Procedures   Exercise Tolerance Test   EKG 12-Lead   ECHOCARDIOGRAM COMPLETE   Meds ordered this encounter  Medications   carvedilol (COREG) 3.125 MG tablet    Sig: Take 1 tablet (3.125 mg total) by mouth 2 (two) times daily.    Dispense:  180 tablet    Refill:  3    Signed, Shenicka Sunderlin reddy Brenlee Koskela, MD, MPH, [facility]. 09/07/2023 4:47 PM     [facility]

## 2023-09-07 NOTE — Assessment & Plan Note (Signed)
 Pleuritic sounding chest pain lasted for over 10 days gradually subsided. Differential diagnosis includes pericarditis, pleuritis, musculoskeletal pain related to chest wall syndrome.  Negative troponins, nonspecific EKG changes with T wave inversions in lead III and nonspecific changes in aVF. CTA excluded pulmonary embolism at the ER visit.  Low suspicion for this to have been coronary related symptoms.  She has been symptom-free and do not expect any significant inflammatory markers at this time and hence we will hold off on further blood work testing.  Repeat EKG today in the office less prominent T wave changes in lead III, aVF appears normal.  She has been avoiding physical exertion for fear of cardiac causes.  Will obtain transthoracic echocardiogram to rule out any significant cardiac structural and functional abnormalities and any significant features suggestive of pericardial disease.  Will obtain a formal treadmill EKG stress test to assess objectively her functional capacity and to rule out any significant arrhythmia concerns with exertion before she can embark on a regular exercise regimen.

## 2023-09-07 NOTE — Assessment & Plan Note (Signed)
 Blood pressures remain elevated suboptimal. Target blood pressure below 130/80 mmHg. Continue amlodipine 5 mg once daily. Transition from metoprolol  to carvedilol 3.125 mg twice daily.

## 2023-09-18 ENCOUNTER — Telehealth: Payer: Self-pay | Admitting: *Deleted

## 2023-09-18 NOTE — Telephone Encounter (Signed)
 Left detailed instructions for ETT. Hold Coreg .

## 2023-09-25 ENCOUNTER — Ambulatory Visit

## 2023-09-25 ENCOUNTER — Ambulatory Visit (INDEPENDENT_AMBULATORY_CARE_PROVIDER_SITE_OTHER)

## 2023-09-25 DIAGNOSIS — R079 Chest pain, unspecified: Secondary | ICD-10-CM

## 2023-09-26 ENCOUNTER — Ambulatory Visit: Payer: Self-pay

## 2023-09-26 LAB — EXERCISE TOLERANCE TEST
Angina Index: 0
Base ST Depression (mm): 0 mm
Duke Treadmill Score: 9
Estimated workload: 10.1
Exercise duration (min): 9 min
Exercise duration (sec): 0 s
MPHR: 166 {beats}/min
Peak HR: 162 {beats}/min
Percent HR: 97 %
Rest HR: 70 {beats}/min
ST Depression (mm): 0 mm

## 2023-09-26 LAB — ECHOCARDIOGRAM COMPLETE
AR max vel: 1.31 cm2
AV Area VTI: 1.36 cm2
AV Area mean vel: 1.32 cm2
AV Mean grad: 6 mmHg
AV Peak grad: 10.1 mmHg
Ao pk vel: 1.59 m/s
Area-P 1/2: 2.19 cm2
MV VTI: 0.99 cm2
S' Lateral: 2.6 cm

## 2023-10-12 ENCOUNTER — Encounter (HOSPITAL_COMMUNITY): Payer: Self-pay | Admitting: *Deleted

## 2023-12-26 ENCOUNTER — Ambulatory Visit

## 2023-12-26 VITALS — BP 132/90 | HR 60 | Ht 61.0 in | Wt 163.0 lb

## 2023-12-26 DIAGNOSIS — E669 Obesity, unspecified: Secondary | ICD-10-CM | POA: Diagnosis not present

## 2023-12-26 DIAGNOSIS — I10 Essential (primary) hypertension: Secondary | ICD-10-CM

## 2023-12-26 NOTE — Assessment & Plan Note (Signed)
 Has lost 20 pounds in the last 4 months with optimizing her diet and increasing her activity and regularly walking for exercise.  Congratulated her on the achievement and encouraged her to continue maintaining healthy diet and lifestyle. Benefit of this with regards to blood pressure control also reviewed.

## 2023-12-26 NOTE — Assessment & Plan Note (Addendum)
 Suboptimal control here. Home blood pressure readings are better controlled. Advised to get her home blood pressure device validated at her upcoming PCP's office visit on October 14.  Continue amlodipine 5 mg once daily Continue carvedilol  3.125 mg twice daily. Target blood pressure below 130/80 mmHg. Continue to follow-up with PCP and see us  on an as-needed basis.

## 2023-12-26 NOTE — Progress Notes (Signed)
 Cardiology Consultation:    Date:  12/26/2023   ID:  Kayla Cooley, DOB Apr 02, 1970, MRN [id number]  PCP:  Sabas Norleen PARAS., MD  Cardiologist:  Alean SAUNDERS Nakia Koble, MD   Referring MD: Sabas Norleen PARAS., MD   No chief complaint on file.    ASSESSMENT AND PLAN:   [name] 53 year old woman history of GERD, gastrectomy 2014 for severe reflux symptoms, history of hysterectomy 2009, hypertension, obesity, occasional use of marijuana Gummies. With ER visit in May for discomfort in the upper back radiating to the chest and abnormal EKG changes, evaluated with stress test and echocardiogram   Problem List Items Addressed This Visit     Obesity (BMI 30-39.9)   Has lost 20 pounds in the last 4 months with optimizing her diet and increasing her activity and regularly walking for exercise.  Congratulated her on the achievement and encouraged her to continue maintaining healthy diet and lifestyle. Benefit of this with regards to blood pressure control also reviewed.       Hypertension - Primary   Suboptimal control here. Home blood pressure readings are better controlled. Advised to get her home blood pressure device validated at her upcoming PCP's office visit on October 14.  Continue amlodipine 5 mg once daily Continue carvedilol  3.125 mg twice daily. Target blood pressure below 130/80 mmHg. Continue to follow-up with PCP and see us  on an as-needed basis.       Follow-up with us  in the office on an as-needed basis.  History of Present Illness:    Kayla Cooley is a 53 y.o. female who is being seen today for follow-up visit. PCP is Sabas Norleen PARAS., MD.  Last pulmonary office 09/07/2023.  Pleasant woman here for the visit today by herself.  Lives with her husband.  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  Has been exercising regularly walking and adjusted her diet and lost 16 pounds since her last visit with us  in the  office.  history of GERD, gastrectomy 2014 for severe reflux symptoms, history of hysterectomy 2009, hypertension, obesity, occasional use of marijuana Gummies. With ER visit in May for discomfort in the upper back radiating to the chest and abnormal EKG changes, evaluated with stress test and echocardiogram.  Exercise stress test 09/25/2023, was on treadmill for 9 minutes, attained 97% MPHR, 10.1 METS, no ischemic findings on EKG, normal heart rate and blood pressure response.  Resting blood pressures were noted to be elevated 167/99 mmHg.  Echocardiogram 09/25/2023 noted normal biventricular function LVEF 60 to 65%, normal diastolic function, no significant valve abnormalities.  She feels her prior symptoms that prompted the work may have been related to undiagnosed cholelithiasis  Here today to follow-up with regards to her blood pressure management. Home blood pressure readings that she had kept a log of occasionally noted normal readings. Blood pressures here in the office today mildly suboptimal 130/90 mmHg. Has not validated the home blood pressure device with any offices yet.  Past Medical History:  Diagnosis Date   Allergy    Arthritis    Gastroesophageal reflux disease    Headache(784.0)    migraines but none since hysterectomy   Hypertension    HX OF, PRIOR TO SLEEVE GASTRECTOMY OFF SINCE 2015   PONV (postoperative nausea and vomiting)     Past Surgical History:  Procedure Laterality Date   29 HOUR PH STUDY N/A 06/26/2016   Procedure: 24 HOUR PH STUDY;  Surgeon: Gustav Shila GAILS, MD;  Location: [facility];  Service: Endoscopy;  Laterality: N/A;   ARTHROSCOPIC REPAIR ACL Right    BREAST REDUCTION SURGERY  1992   CARPAL TUNNEL RELEASE  2001   right hand   ESOPHAGEAL MANOMETRY N/A 06/26/2016   Procedure: ESOPHAGEAL MANOMETRY (EM);  Surgeon: Gustav Shila GAILS, MD;  Location: [facility];  Service: Endoscopy;  Laterality: N/A;   ESOPHAGOGASTRODUODENOSCOPY (EGD) WITH PROPOFOL   N/A 05/01/2016   Procedure: ESOPHAGOGASTRODUODENOSCOPY (EGD) WITH PROPOFOL  POSSIBLE BIOPSY;  Surgeon: Camellia Blush, MD;  Location: [facility];  Service: General;  Laterality: N/A;   GASTRIC ROUX-EN-Y N/A 03/05/2017   Procedure: Conversion to LAPAROSCOPIC ROUX-EN-Y GASTRIC BYPASS WITH UPPER ENDOSCOPY,  Hiatal Hernia Repair;  Surgeon: Blush Camellia, MD;  Location: [facility];  Service: General;  Laterality: N/A;   KNEE ARTHROSCOPY Left    x 2   KNEE ARTHROSCOPY W/ ACL RECONSTRUCTION Left    LAPAROSCOPIC GASTRIC SLEEVE RESECTION N/A 01/13/2013   Procedure: LAPAROSCOPIC GASTRIC SLEEVE RESECTION;  Surgeon: Redell Faith, DO;  Location: [facility];  Service: General;  Laterality: N/A;   PARTIAL HYSTERECTOMY  2009   REPLACEMENT TOTAL KNEE Left    skin removal surgery     TUBAL LIGATION     UPPER GI ENDOSCOPY  01/13/2013   Procedure: UPPER GI ENDOSCOPY;  Surgeon: Redell Faith, DO;  Location: [facility];  Service: General;;   uterine ablation      Current Medications: Current Meds  Medication Sig   acetaminophen  (TYLENOL ) 500 MG tablet Take 1,000 mg by mouth every 6 (six) hours as needed for moderate pain or headache.   amLODipine (NORVASC) 5 MG tablet Take 5 mg by mouth daily.   Biotin 5000 MCG CAPS Take 5,000 mcg by mouth daily.   calcium carbonate (TUMS EX) 750 MG chewable tablet Chew 1 tablet by mouth daily.   carvedilol  (COREG ) 3.125 MG tablet Take 1 tablet (3.125 mg total) by mouth 2 (two) times daily.   cetirizine (ZYRTEC) 10 MG tablet Take 10 mg by mouth daily.    Fexofenadine HCl (ALLERGY 24-HR PO) Take 1 tablet by mouth daily.   Multiple Vitamin (MULTIVITAMIN WITH MINERALS) TABS tablet Take 1 tablet by mouth daily. (Either bariatric or prenatal)   Prenatal Vit-Fe Fumarate-FA (MULTIVITAMIN-PRENATAL) 27-0.8 MG TABS tablet Take 1 tablet by mouth daily at 12 noon.   Probiotic Product (PROBIOTIC DAILY PO) Take 1 capsule by mouth daily.     Allergies:   Aspirin and Cashew nut oil   Social History    Socioeconomic History   Marital status: Married    Spouse name: Not on file   Number of children: Not on file   Years of education: Not on file   Highest education level: Not on file  Occupational History   Not on file  Tobacco Use   Smoking status: Never   Smokeless tobacco: Never  Substance and Sexual Activity   Alcohol use: Yes    Comment: socially   Drug use: No   Sexual activity: Yes  Other Topics Concern   Not on file  Social History Narrative   Not on file   Social Drivers of Health   Financial Resource Strain: Low Risk  (10/01/2019)   Received from [facility] visits prior to 05/27/2022.   Overall Financial Resource Strain (CARDIA)    Difficulty of Paying Living Expenses: Not very hard  Food Insecurity: No Food Insecurity (10/01/2019)   Received from [facility] visits prior to 05/27/2022.   Hunger Vital Sign  Within the past 12 months, you worried that your food would run out before you got the money to buy more.: Never true    Within the past 12 months, the food you bought just didn't last and you didn't have money to get more.: Never true  Transportation Needs: No Transportation Needs (10/01/2019)   Received from [facility] visits prior to 05/27/2022.   PRAPARE - Administrator, Civil Service (Medical): No    Lack of Transportation (Non-Medical): No  Physical Activity: Sufficiently Active (10/01/2019)   Received from [facility] visits prior to 05/27/2022.   Exercise Vital Sign    On average, how many days per week do you engage in moderate to strenuous exercise (like a brisk walk)?: 6 days    On average, how many minutes do you engage in exercise at this level?: 40 min  Stress: No Stress Concern Present (10/01/2019)   Received from [facility] visits prior to 05/27/2022.   [employer] of Occupational Health - Occupational Stress Questionnaire     Feeling of Stress : Only a little  Social Connections: Moderately Integrated (10/01/2019)   Received from [facility] visits prior to 05/27/2022.   Social Connection and Isolation Panel    In a typical week, how many times do you talk on the phone with family, friends, or neighbors?: Twice a week    How often do you get together with friends or relatives?: Three times a week    How often do you attend church or religious services?: Never    Do you belong to any clubs or organizations such as church groups, unions, fraternal or athletic groups, or school groups?: Yes    How often do you attend meetings of the clubs or organizations you belong to?: 1 to 4 times per year    Are you married, widowed, divorced, separated, never married, or living with a partner?: Married     Family History: The patient's family history includes Breast cancer in her paternal aunt; Diabetes in her maternal grandmother; Heart disease in her father; Prostate cancer in her maternal grandfather. There is no history of Colon cancer. ROS:   Please see the history of present illness.    All 14 point review of systems negative except as described per history of present illness.  EKGs/Labs/Other Studies Reviewed:    The following studies were reviewed today:   EKG:       Recent Labs: 08/23/2023: BUN 15; Creatinine, Ser 0.71; Hemoglobin 13.7; Platelets 266; Potassium 3.9; Sodium 139  Recent Lipid Panel    Component Value Date/Time   CHOL 162 06/05/2013 0715   TRIG 81 06/05/2013 0715   HDL 50 06/05/2013 0715   CHOLHDL 3.2 06/05/2013 0715   VLDL 16 06/05/2013 0715   LDLCALC 96 06/05/2013 0715    Physical Exam:    VS:  BP (!) 130/90   Pulse 60   Ht 5' 1 (1.549 m)   Wt 163 lb (73.9 kg)   SpO2 99%   BMI 30.80 kg/m     Wt Readings from Last 3 Encounters:  12/26/23 163 lb (73.9 kg)  09/07/23 179 lb (81.2 kg)  08/23/23 183 lb (83 kg)     GENERAL:  Well nourished, well developed in no  acute distress NECK: No JVD; No carotid bruits CARDIAC: RRR, S1 and S2 present, no murmurs, no rubs, no gallops NEUROLOGIC:  Alert and oriented x  3  Medication Adjustments/Labs and Tests Ordered: Current medicines are reviewed at length with the patient today.  Concerns regarding medicines are outlined above.  No orders of the defined types were placed in this encounter.  No orders of the defined types were placed in this encounter.   Signed, Mazie Fencl reddy Jolleen Seman, MD, MPH, [facility]. 12/26/2023 8:56 AM     [facility]

## 2023-12-26 NOTE — Patient Instructions (Signed)
 Please keep a BP log for 4 weeks and send by MyChart or mail.                         Name and DOB__________________________ Dr. Madireddy [address] [place], [place] [address]  Blood Pressure Record Sheet To take your blood pressure, you will need a blood pressure machine. You can buy a blood pressure machine (blood pressure monitor) at your clinic, drug store, or online. When choosing one, consider: An automatic monitor that has an arm cuff. A cuff that wraps snugly around your upper arm. You should be able to fit only one finger between your arm and the cuff. A device that stores blood pressure reading results. Do not choose a monitor that measures your blood pressure from your wrist or finger. Follow your health care provider's instructions for how to take your blood pressure. To use this form: Get one reading in the morning (a.m.) 1-2 hours after you take any medicines. Get one reading in the evening (p.m.) before supper.   Blood pressure log Date: _______________________  a.m. _____________________(1st reading) HR___________            p.m. _____________________(2nd reading) HR__________  Date: _______________________  a.m. _____________________(1st reading) HR___________            p.m. _____________________(2nd reading) HR__________  Date: _______________________  a.m. _____________________(1st reading) HR___________            p.m. _____________________(2nd reading) HR__________  Date: _______________________  a.m. _____________________(1st reading) HR___________            p.m. _____________________(2nd reading) HR__________  Date: _______________________  a.m. _____________________(1st reading) HR___________            p.m. _____________________(2nd reading) HR__________  Date: _______________________  a.m. _____________________(1st reading) HR___________            p.m. _____________________(2nd reading) HR__________  Date:  _______________________  a.m. _____________________(1st reading) HR___________            p.m. _____________________(2nd reading) HR__________   This information is not intended to replace advice given to you by your health care provider. Make sure you discuss any questions you have with your health care provider. Document Revised: 07/02/2019 Document Reviewed: 07/02/2019 Elsevier Patient Education  2021 Elsevier Inc.   Medication Instructions:  Your physician recommends that you continue on your current medications as directed. Please refer to the Current Medication list given to you today.  *If you need a refill on your cardiac medications before your next appointment, please call your pharmacy*   Lab Work: None ordered If you have labs (blood work) drawn today and your tests are completely normal, you will receive your results only by: MyChart Message (if you have MyChart) OR A paper copy in the mail If you have any lab test that is abnormal or we need to change your treatment, we will call you to review the results.   Testing/Procedures: None ordered   Follow-Up: At [facility], you and your health needs are our priority.  As part of our continuing mission to provide you with exceptional heart care, we have created designated Provider Care Teams.  These Care Teams include your primary Cardiologist (physician) and Advanced Practice Providers (APPs -  Physician Assistants and Nurse Practitioners) who all work together to provide you with the care you need, when you need it.  We recommend signing up for the patient portal called MyChart.  Sign up information is provided on this  After Visit Summary.  MyChart is used to connect with patients for Virtual Visits (Telemedicine).  Patients are able to view lab/test results, encounter notes, upcoming appointments, etc.  Non-urgent messages can be sent to your provider as well.   To learn more about what you can do with  MyChart, go to [email or web address].    Your next appointment:   Follow up as needed   The format for your next appointment:   In Person  Provider:   Alean Kobus, MD    Other Instructions none  Important Information About Sugar
